# Patient Record
Sex: Male | Born: 1970 | Race: Black or African American | Hispanic: No | Marital: Single | Smoking: Former smoker
Health system: Southern US, Community
[De-identification: ages and names within clinical notes are randomized; demographics above are authoritative.]

## PROBLEM LIST (undated history)

## (undated) ENCOUNTER — Emergency Department (HOSPITAL_COMMUNITY): Admission: EM | Payer: 59 | Source: Home / Self Care

## (undated) DIAGNOSIS — I10 Essential (primary) hypertension: Secondary | ICD-10-CM

## (undated) HISTORY — PX: NO PAST SURGERIES: SHX2092

## (undated) HISTORY — DX: Essential (primary) hypertension: I10

---

## 2000-03-02 ENCOUNTER — Encounter: Payer: Self-pay | Admitting: Emergency Medicine

## 2000-03-02 ENCOUNTER — Emergency Department (HOSPITAL_COMMUNITY): Admission: EM | Admit: 2000-03-02 | Discharge: 2000-03-03 | Payer: Self-pay | Admitting: Emergency Medicine

## 2005-08-30 ENCOUNTER — Emergency Department (HOSPITAL_COMMUNITY): Admission: EM | Admit: 2005-08-30 | Discharge: 2005-08-30 | Payer: Self-pay | Admitting: Family Medicine

## 2007-04-02 ENCOUNTER — Emergency Department (HOSPITAL_COMMUNITY): Admission: EM | Admit: 2007-04-02 | Discharge: 2007-04-02 | Payer: Self-pay | Admitting: Family Medicine

## 2009-10-02 ENCOUNTER — Emergency Department (HOSPITAL_COMMUNITY): Admission: EM | Admit: 2009-10-02 | Discharge: 2009-10-02 | Payer: Self-pay | Admitting: Emergency Medicine

## 2010-07-15 LAB — RPR: RPR Ser Ql: NONREACTIVE

## 2010-07-15 LAB — HIV ANTIBODY (ROUTINE TESTING W REFLEX): HIV: NONREACTIVE

## 2010-07-15 LAB — GC/CHLAMYDIA PROBE AMP, GENITAL
Chlamydia, DNA Probe: NEGATIVE
GC Probe Amp, Genital: NEGATIVE

## 2010-10-31 ENCOUNTER — Inpatient Hospital Stay (INDEPENDENT_AMBULATORY_CARE_PROVIDER_SITE_OTHER)
Admission: RE | Admit: 2010-10-31 | Discharge: 2010-10-31 | Disposition: A | Discharge status: Home / Self Care | Payer: Self-pay | Source: Ambulatory Visit | Attending: Family Medicine | Admitting: Family Medicine

## 2010-10-31 ENCOUNTER — Ambulatory Visit (INDEPENDENT_AMBULATORY_CARE_PROVIDER_SITE_OTHER): Payer: Self-pay

## 2010-10-31 DIAGNOSIS — R071 Chest pain on breathing: Secondary | ICD-10-CM

## 2011-07-12 ENCOUNTER — Emergency Department (HOSPITAL_COMMUNITY): Payer: 59

## 2011-07-12 ENCOUNTER — Emergency Department (HOSPITAL_COMMUNITY)
Admission: EM | Admit: 2011-07-12 | Discharge: 2011-07-12 | Disposition: A | Discharge status: Home / Self Care | Payer: 59 | Attending: Emergency Medicine | Admitting: Emergency Medicine

## 2011-07-12 ENCOUNTER — Encounter (HOSPITAL_COMMUNITY): Payer: Self-pay | Admitting: Family Medicine

## 2011-07-12 DIAGNOSIS — M542 Cervicalgia: Secondary | ICD-10-CM | POA: Insufficient documentation

## 2011-07-12 DIAGNOSIS — M546 Pain in thoracic spine: Secondary | ICD-10-CM | POA: Insufficient documentation

## 2011-07-12 DIAGNOSIS — R079 Chest pain, unspecified: Secondary | ICD-10-CM | POA: Insufficient documentation

## 2011-07-12 DIAGNOSIS — S42033A Displaced fracture of lateral end of unspecified clavicle, initial encounter for closed fracture: Secondary | ICD-10-CM | POA: Insufficient documentation

## 2011-07-12 DIAGNOSIS — M25519 Pain in unspecified shoulder: Secondary | ICD-10-CM | POA: Insufficient documentation

## 2011-07-12 DIAGNOSIS — IMO0002 Reserved for concepts with insufficient information to code with codable children: Secondary | ICD-10-CM | POA: Insufficient documentation

## 2011-07-12 DIAGNOSIS — R109 Unspecified abdominal pain: Secondary | ICD-10-CM | POA: Insufficient documentation

## 2011-07-12 LAB — RAPID URINE DRUG SCREEN, HOSP PERFORMED
Amphetamines: NOT DETECTED
Barbiturates: NOT DETECTED
Benzodiazepines: NOT DETECTED
Cocaine: NOT DETECTED
Opiates: NOT DETECTED
Tetrahydrocannabinol: NOT DETECTED

## 2011-07-12 LAB — URINE MICROSCOPIC-ADD ON

## 2011-07-12 LAB — DIFFERENTIAL
Basophils Relative: 0 % (ref 0–1)
Lymphocytes Relative: 56 % — ABNORMAL HIGH (ref 12–46)
Monocytes Relative: 5 % (ref 3–12)
Neutro Abs: 2.8 10*3/uL (ref 1.7–7.7)
Neutrophils Relative %: 38 % — ABNORMAL LOW (ref 43–77)

## 2011-07-12 LAB — COMPREHENSIVE METABOLIC PANEL
ALT: 49 U/L (ref 0–53)
AST: 44 U/L — ABNORMAL HIGH (ref 0–37)
Albumin: 4.3 g/dL (ref 3.5–5.2)
Alkaline Phosphatase: 46 U/L (ref 39–117)
BUN: 13 mg/dL (ref 6–23)
CO2: 23 mEq/L (ref 19–32)
Calcium: 8.9 mg/dL (ref 8.4–10.5)
Chloride: 103 mEq/L (ref 96–112)
Creatinine, Ser: 1.12 mg/dL (ref 0.50–1.35)
GFR calc Af Amer: >90 mL/min (ref >90)
GFR calc non Af Amer: 81 mL/min — ABNORMAL LOW (ref >90)
Glucose, Bld: 108 mg/dL — ABNORMAL HIGH (ref 70–99)
Potassium: 3.4 mEq/L — ABNORMAL LOW (ref 3.5–5.1)
Sodium: 141 mEq/L (ref 135–145)
Total Bilirubin: 0.3 mg/dL (ref 0.3–1.2)
Total Protein: 7.7 g/dL (ref 6.0–8.3)

## 2011-07-12 LAB — APTT: aPTT: 25 seconds (ref 24–37)

## 2011-07-12 LAB — CBC
HCT: 46.7 % (ref 39.0–52.0)
Hemoglobin: 16.2 g/dL (ref 13.0–17.0)
MCHC: 34.7 g/dL (ref 30.0–36.0)
RBC: 5.45 MIL/uL (ref 4.22–5.81)
WBC: 7.2 10*3/uL (ref 4.0–10.5)

## 2011-07-12 LAB — TYPE AND SCREEN
ABO/RH(D): B POS
Antibody Screen: NEGATIVE

## 2011-07-12 LAB — URINALYSIS, ROUTINE W REFLEX MICROSCOPIC
Bilirubin Urine: NEGATIVE
Glucose, UA: NEGATIVE mg/dL
Ketones, ur: NEGATIVE mg/dL
Leukocytes, UA: NEGATIVE
Nitrite: NEGATIVE
Protein, ur: NEGATIVE mg/dL
Specific Gravity, Urine: 1.027 (ref 1.005–1.030)
Urobilinogen, UA: 0.2 mg/dL (ref 0.0–1.0)
pH: 5 (ref 5.0–8.0)

## 2011-07-12 MED ORDER — HYDROCODONE-ACETAMINOPHEN 5-500 MG PO TABS: 1.0000 | ORAL_TABLET | Freq: Four times a day (QID) | ORAL | Status: AC | PRN

## 2011-07-12 MED ORDER — IOHEXOL 300 MG/ML  SOLN
100.0000 mL | Freq: Once | INTRAMUSCULAR | Status: AC | PRN
  Administered 2011-07-12: 100 mL via INTRAVENOUS

## 2011-07-12 MED ORDER — TETANUS-DIPHTH-ACELL PERTUSSIS 5-2.5-18.5 LF-MCG/0.5 IM SUSP
0.5000 mL | Freq: Once | INTRAMUSCULAR | Status: AC
  Administered 2011-07-12: 0.5 mL via INTRAMUSCULAR
  Filled 2011-07-12: qty 0.5

## 2011-07-12 MED ORDER — SODIUM CHLORIDE 0.9 % IV BOLUS (SEPSIS)
1000.0000 mL | Freq: Once | INTRAVENOUS | Status: AC
  Administered 2011-07-12: 1000 mL via INTRAVENOUS

## 2011-07-12 NOTE — ED Notes (Signed)
Patient currently resting quietly in bed; no respiratory or acute distress noted.  Patient updated on plan of care; informed patient that EDP wants him to ambulate before being discharged.  Family now at bedside.  Patient has no other questions or concerns at this time; will continue to monitor.

## 2011-07-12 NOTE — ED Notes (Signed)
Pt provided with meal tray.

## 2011-07-12 NOTE — ED Notes (Signed)
Patient signed electronic signature; signature pad not working correctly (not crossing over to computer).

## 2011-07-12 NOTE — ED Notes (Signed)
Pt transported to CT ?

## 2011-07-12 NOTE — ED Notes (Signed)
Return from CT

## 2011-07-12 NOTE — Progress Notes (Signed)
Orthopedic Tech Progress Note Patient Details:  Albert Yoder January 09, 1971 [id number]  Other Ortho Devices Type of Ortho Device: Other (comment) (foam arm sling) Ortho Device Location: right arm Ortho Device Interventions: Application   Nikki Dom 07/12/2011, 5:43 PM
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
## 2011-07-12 NOTE — ED Notes (Signed)
Ortho tech at bedside to apply sling 

## 2011-07-12 NOTE — ED Notes (Signed)
Patient given discharge paperwork; went over discharge instructions with patient.  Patient instructed to take Vicodin as directed, to follow up with Dr. Magnus Ivan within one week, and to return to the ED for new, worsening, or concerning symptoms.  Patient instructed to keep arm sling in place until following up with orthopedist.
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
## 2011-07-12 NOTE — ED Notes (Signed)
Ortho paged for sling  

## 2011-07-12 NOTE — Discharge Instructions (Signed)
Clavicle Fracture  A clavicle fracture is a break in the collarbone. This is a common injury, especially in children. Collarbones do not harden until around the age of 20. Most collarbone fractures are treated with a simple arm sling. In some cases a figure-of-eight splint is used to help hold the broken bones in position. Although not often needed, surgery may be required if the bone fragments are not in the correct position (displaced).   HOME CARE INSTRUCTIONS    Apply ice to the injury for 15 to 20 minutes each hour while awake for 2 days. Put the ice in a plastic bag and place a towel between the bag of ice and your skin.   Wear the sling or splint constantly for as long as directed by your caregiver. You may remove the sling or splint for bathing or showering. Be sure to keep your shoulder in the same place as when the sling or splint is on. Do not lift your arm.   If a figure-of-eight splint is applied, it must be tightened by another person every day. Tighten it enough to keep the shoulders held back. Allow enough room to place the index finger between the body and strap. Loosen the splint immediately if you feel numbness or tingling in your hands.   Only take over-the-counter or prescription medicines for pain, discomfort, or fever as directed by your caregiver.   Avoid activities that irritate or increase the pain for 4 to 6 weeks after surgery.   Follow all instructions for follow-up with your caregiver. This includes any referrals, physical therapy, and rehabilitation. Any delay in obtaining necessary care could result in a delay or failure of the injury to heal properly.  SEEK MEDICAL CARE IF:   You have pain and swelling that are not relieved with medications.  SEEK IMMEDIATE MEDICAL CARE IF:   Your arm is numb, cold, or pale, even when the splint is loose.  MAKE SURE YOU:    Understand these instructions.   Will watch your condition.   Will get help right away if you are not doing well or get  worse.  Document Released: 01/22/2005 Document Revised: 04/03/2011 Document Reviewed: 11/18/2007  [employer] Patient Information 2012 [employer].

## 2011-07-12 NOTE — ED Notes (Signed)
Received bedside report from [place], [place].  Patient currently resting quietly in bed; no respiratory or acute distress noted.  Will continue to monitor.

## 2011-07-13 NOTE — ED Provider Notes (Signed)
I saw and evaluated the patient, reviewed the resident's note and I agree with the findings and plan.   Loren Racer, MD 07/13/11 [phone]

## 2011-07-15 DIAGNOSIS — S42009A Fracture of unspecified part of unspecified clavicle, initial encounter for closed fracture: Secondary | ICD-10-CM | POA: Insufficient documentation

## 2011-07-16 ENCOUNTER — Encounter (HOSPITAL_COMMUNITY): Payer: Self-pay

## 2011-07-16 ENCOUNTER — Emergency Department (HOSPITAL_COMMUNITY)
Admission: EM | Admit: 2011-07-16 | Discharge: 2011-07-16 | Disposition: A | Discharge status: Home / Self Care | Payer: 59 | Attending: Emergency Medicine | Admitting: Emergency Medicine

## 2011-07-16 DIAGNOSIS — S42009A Fracture of unspecified part of unspecified clavicle, initial encounter for closed fracture: Secondary | ICD-10-CM

## 2011-07-16 MED ORDER — TRAMADOL HCL 50 MG PO TABS: 50.0000 mg | ORAL_TABLET | Freq: Four times a day (QID) | ORAL | Status: AC | PRN

## 2011-07-16 MED ORDER — DIAZEPAM 5 MG PO TABS
5.0000 mg | ORAL_TABLET | Freq: Once | ORAL | Status: AC
  Administered 2011-07-16: 5 mg via ORAL
  Filled 2011-07-16: qty 1

## 2011-07-16 MED ORDER — ONDANSETRON 8 MG PO TBDP
8.0000 mg | ORAL_TABLET | Freq: Once | ORAL | Status: AC
  Administered 2011-07-16: 8 mg via ORAL
  Filled 2011-07-16: qty 1

## 2011-07-16 MED ORDER — IBUPROFEN 800 MG PO TABS: 800.0000 mg | ORAL_TABLET | Freq: Three times a day (TID) | ORAL | Status: AC | PRN

## 2011-07-16 MED ORDER — HYDROMORPHONE HCL PF 1 MG/ML IJ SOLN
1.0000 mg | Freq: Once | INTRAMUSCULAR | Status: AC
  Administered 2011-07-16: 1 mg via INTRAVENOUS
  Filled 2011-07-16: qty 1

## 2011-07-16 MED ORDER — CYCLOBENZAPRINE HCL 10 MG PO TABS: 10.0000 mg | ORAL_TABLET | Freq: Three times a day (TID) | ORAL | Status: AC | PRN

## 2011-07-16 MED ORDER — IBUPROFEN 800 MG PO TABS
800.0000 mg | ORAL_TABLET | Freq: Once | ORAL | Status: AC
  Administered 2011-07-16: 800 mg via ORAL
  Filled 2011-07-16: qty 1
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
## 2011-07-16 NOTE — Discharge Instructions (Signed)
Please review the instructions below. As discussed, you have been through the acute phase of bone healing and your pain should begin to get better. We have given you something for pain here in the ED tonight and we're prescribing you a short course of Tramadol, Ibuprofen and  A muscle relaxer. Take medications as directed and keep your scheduled appointment with Dr. Magnus Ivan on 07/21/2011.     Clavicle Fracture A clavicle fracture is a broken clavicle (collarbone). The clavicle connects the chest to the shoulder. Most broken clavicles are treated with an arm sling. HOME CARE  Put ice on the injured area.   Put ice in a plastic bag.   Place a towel between the skin and the bag.   Leave the ice on for 15 to 20 minutes, 3 to 4 times a day. Do this for the first 2 days.   Wear the sling or splint for as long as told by your doctor. You may remove the sling or splint for bathing or showering. Keep the shoulder in the same place as when the sling or splint is on. Do not lift the arm.   Allow enough room to place the index finger between the body and strap of the splint. Loosen the splint right away if you lose feeling (numbness) or have tingling in the hands.   Only take medicine as told by your doctor.   Avoid activities that increase pain for 4 to 6 weeks, or as told by your doctor.  GET HELP RIGHT AWAY IF:   There is pain and puffiness (swelling) that is not helped with medicine.   The arm is numb, cold, or pale, even when the sling or splint is loose.  MAKE SURE YOU:   Understand these instructions.   Will watch this condition.   Will get help right away if you or your child is not doing well or gets worse.  Document Released: 10/01/2007 Document Revised: 04/03/2011 Document Reviewed: 01/30/2009 [facility] Patient Information 2012 [place], [place].
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
## 2011-07-25 ENCOUNTER — Emergency Department (HOSPITAL_COMMUNITY)
Admission: EM | Admit: 2011-07-25 | Discharge: 2011-07-25 | Disposition: A | Discharge status: Home / Self Care | Payer: 59 | Attending: Emergency Medicine | Admitting: Emergency Medicine

## 2011-07-25 ENCOUNTER — Encounter (HOSPITAL_COMMUNITY): Payer: Self-pay | Admitting: *Deleted

## 2011-07-25 DIAGNOSIS — F17200 Nicotine dependence, unspecified, uncomplicated: Secondary | ICD-10-CM | POA: Insufficient documentation

## 2011-07-25 DIAGNOSIS — M25559 Pain in unspecified hip: Secondary | ICD-10-CM | POA: Insufficient documentation

## 2011-07-25 DIAGNOSIS — R1900 Intra-abdominal and pelvic swelling, mass and lump, unspecified site: Secondary | ICD-10-CM

## 2011-07-25 DIAGNOSIS — IMO0002 Reserved for concepts with insufficient information to code with codable children: Secondary | ICD-10-CM | POA: Insufficient documentation

## 2011-07-25 DIAGNOSIS — S7010XA Contusion of unspecified thigh, initial encounter: Secondary | ICD-10-CM | POA: Insufficient documentation

## 2011-07-25 LAB — POCT I-STAT, CHEM 8
Calcium, Ion: 1.14 mmol/L (ref 1.12–1.32)
Chloride: 105 mEq/L (ref 96–112)
HCT: 51 % (ref 39.0–52.0)
Hemoglobin: 17.3 g/dL — ABNORMAL HIGH (ref 13.0–17.0)
TCO2: 24 mmol/L (ref 0–100)

## 2011-07-25 LAB — DIFFERENTIAL
Basophils Absolute: 0 10*3/uL (ref 0.0–0.1)
Eosinophils Relative: 3 % (ref 0–5)
Lymphocytes Relative: 41 % (ref 12–46)
Neutro Abs: 3.4 10*3/uL (ref 1.7–7.7)

## 2011-07-25 LAB — CBC
Platelets: 265 10*3/uL (ref 150–400)
RDW: 13.1 % (ref 11.5–15.5)
WBC: 7 10*3/uL (ref 4.0–10.5)

## 2011-07-25 LAB — URINALYSIS, ROUTINE W REFLEX MICROSCOPIC
Ketones, ur: NEGATIVE mg/dL
Leukocytes, UA: NEGATIVE
Nitrite: NEGATIVE
Urobilinogen, UA: 0.2 mg/dL (ref 0.0–1.0)
pH: 5 (ref 5.0–8.0)

## 2011-07-25 MED ORDER — FENTANYL CITRATE 0.05 MG/ML IJ SOLN
50.0000 ug | Freq: Once | INTRAMUSCULAR | Status: AC
  Administered 2011-07-25: 50 ug via INTRAVENOUS
  Filled 2011-07-25: qty 2

## 2011-07-25 MED ORDER — ONDANSETRON HCL 4 MG/2ML IJ SOLN
4.0000 mg | Freq: Once | INTRAMUSCULAR | Status: AC
  Administered 2011-07-25: 4 mg via INTRAVENOUS
  Filled 2011-07-25: qty 2

## 2011-07-25 MED ORDER — HYDROCODONE-ACETAMINOPHEN 5-500 MG PO TABS: 1.0000 | ORAL_TABLET | Freq: Four times a day (QID) | ORAL | Status: AC | PRN
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
## 2011-07-25 NOTE — Discharge Instructions (Signed)
Use warm compresses to thigh and continue to elevate to decrease pain. Alternate between ibuprofen and hydrocodone-acetaminophen as needed for pain but do not drive or operate machinery with hydrocodone-acetaminophen use. Followup with Dr. Edilia Bo in his office in 1 to 2 weeks for recheck of ongoing pain associated with large hematoma but return to emergency department for any changing or worsening symptoms.  Hematoma A hematoma is a pocket of blood that collects under the skin, in an organ, in a body space, in a joint space, or in other tissue. The blood can clot to form a lump that you can see and feel. The lump is often firm, sore, and sometimes even painful and tender. Most hematomas get better in a few days to weeks. However, some hematomas may be serious and require medical care.Hematomas can range in size from very small to very large. CAUSES  A hematoma can be caused by a blunt or penetrating injury. It can also be caused by leakage from a blood vessel under the skin. Spontaneous leakage from a blood vessel is more likely to occur in elderly people, especially those taking blood thinners. Sometimes, a hematoma can develop after certain medical procedures. SYMPTOMS  Unlike a bruise, a hematoma forms a firm lump that you can feel. This lump is the collection of blood. The collection of blood can also cause your skin to turn a blue to dark blue color. If the hematoma is close to the surface of the skin, it often produces a yellowish color in the skin. DIAGNOSIS  Your caregiver can determine whether you have a hematoma based on your history and a physical exam. TREATMENT  Hematomas usually go away on their own over time. Rarely does the blood need to be drained out of the body. HOME CARE INSTRUCTIONS   Put ice on the injured area.   Put ice in a plastic bag.   Place a towel between your skin and the bag.   Leave the ice on for 15 to 20 minutes, 3 to 4 times a day for the first 1 to 2 days.     After the first 2 days, switch to using warm compresses on the hematoma.   Elevate the injured area to help decrease pain and swelling. Wrapping the area with an elastic bandage may also be helpful. Compression helps to reduce swelling and promotes shrinking of the hematoma. Make sure the bandage is not wrapped too tight.   If your hematoma is on a lower extremity and is painful, crutches may be helpful for a couple days.   Only take over-the-counter or prescription medicines for pain, discomfort, or fever as directed by your caregiver. Most patients can take acetaminophen or ibuprofen for the pain.  SEEK IMMEDIATE MEDICAL CARE IF:   You have increasing pain, or your pain is not controlled with medicine.   You have a fever.   You have worsening swelling or discoloration.   Your skin over the hematoma breaks or starts bleeding.  MAKE SURE YOU:   Understand these instructions.   Will watch your condition.   Will get help right away if you are not doing well or get worse.  Document Released: 11/27/2003 Document Revised: 04/03/2011 Document Reviewed: 12/16/2010 [facility] Patient Information 2012 [place], [place].

## 2011-07-25 NOTE — ED Notes (Signed)
Pt placed in gown. Pt with noted swelling to right anterior thigh with heat to abdomen and thigh, will move pt to back for further examination.
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
## 2011-07-25 NOTE — Progress Notes (Signed)
VASCULAR LAB PRELIMINARY  PRELIMINARY  PRELIMINARY  PRELIMINARY  Right thigh arterial duplex completed.    Preliminary report:  No evidence of pseudoaneurysm.  Large hypoechoic area from lateral hip to medial thigh.  Probable hematoma.  No vascular compromise noted.  Terance Hart, RVT 07/25/2011, 12:52 PM
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
## 2012-09-02 ENCOUNTER — Ambulatory Visit (INDEPENDENT_AMBULATORY_CARE_PROVIDER_SITE_OTHER): Payer: 59 | Admitting: Internal Medicine

## 2012-09-02 ENCOUNTER — Encounter: Payer: Self-pay | Admitting: Internal Medicine

## 2012-09-02 ENCOUNTER — Other Ambulatory Visit (INDEPENDENT_AMBULATORY_CARE_PROVIDER_SITE_OTHER): Payer: 59

## 2012-09-02 VITALS — BP 130/90 | HR 82 | Temp 98.6°F | Resp 16 | Ht 74.0 in | Wt 239.0 lb

## 2012-09-02 DIAGNOSIS — G4733 Obstructive sleep apnea (adult) (pediatric): Secondary | ICD-10-CM | POA: Insufficient documentation

## 2012-09-02 DIAGNOSIS — I10 Essential (primary) hypertension: Secondary | ICD-10-CM | POA: Insufficient documentation

## 2012-09-02 DIAGNOSIS — R0609 Other forms of dyspnea: Secondary | ICD-10-CM

## 2012-09-02 DIAGNOSIS — Z0000 Encounter for general adult medical examination without abnormal findings: Secondary | ICD-10-CM

## 2012-09-02 DIAGNOSIS — R0683 Snoring: Secondary | ICD-10-CM

## 2012-09-02 LAB — PSA: PSA: 0.82 ng/mL (ref 0.10–4.00)

## 2012-09-02 LAB — URINALYSIS, ROUTINE W REFLEX MICROSCOPIC
Nitrite: NEGATIVE
Specific Gravity, Urine: >=1.03 (ref 1.000–1.030)
Total Protein, Urine: NEGATIVE
pH: 6 (ref 5.0–8.0)

## 2012-09-02 LAB — COMPREHENSIVE METABOLIC PANEL
ALT: 54 U/L — ABNORMAL HIGH (ref 0–53)
AST: 38 U/L — ABNORMAL HIGH (ref 0–37)
CO2: 27 mEq/L (ref 19–32)
Calcium: 9.2 mg/dL (ref 8.4–10.5)
Chloride: 102 mEq/L (ref 96–112)
GFR: 83.62 mL/min (ref >60.00)
Potassium: 4.1 mEq/L (ref 3.5–5.1)
Sodium: 137 mEq/L (ref 135–145)
Total Protein: 7.2 g/dL (ref 6.0–8.3)

## 2012-09-02 LAB — CBC WITH DIFFERENTIAL/PLATELET
Basophils Absolute: 0 10*3/uL (ref 0.0–0.1)
Eosinophils Absolute: 0.2 10*3/uL (ref 0.0–0.7)
Lymphocytes Relative: 36.8 % (ref 12.0–46.0)
Lymphs Abs: 2.6 10*3/uL (ref 0.7–4.0)
MCHC: 34.6 g/dL (ref 30.0–36.0)
Monocytes Relative: 8.7 % (ref 3.0–12.0)
Platelets: 236 10*3/uL (ref 150.0–400.0)
RDW: 13.9 % (ref 11.5–14.6)

## 2012-09-02 LAB — FECAL OCCULT BLOOD, GUAIAC: Fecal Occult Blood: NEGATIVE

## 2012-09-02 LAB — LIPID PANEL: HDL: 40.6 mg/dL (ref >39.00)

## 2012-09-02 MED ORDER — NEBIVOLOL HCL 5 MG PO TABS: 5.0000 mg | ORAL_TABLET | Freq: Every day | ORAL | Status: DC

## 2012-09-02 NOTE — Progress Notes (Signed)
Subjective:    Patient ID: Albert Yoder, male    DOB: [birth year], 42 y.o.   MRN: [id number]  Hypertension The problem is uncontrolled. Associated symptoms include headaches. Pertinent negatives include no anxiety, blurred vision, chest pain, malaise/fatigue, neck pain, orthopnea, palpitations, peripheral edema, PND, shortness of breath or sweats. There are no associated agents to hypertension. Past treatments include nothing. Compliance problems include exercise and diet.  Identifiable causes of hypertension include sleep apnea.      Review of Systems  Constitutional: Negative.  Negative for fever, chills, malaise/fatigue, diaphoresis, activity change, appetite change, fatigue and unexpected weight change.  HENT: Negative for neck pain.   Eyes: Negative.  Negative for blurred vision.  Respiratory: Positive for apnea (and heavy snoring). Negative for cough, choking, chest tightness, shortness of breath, wheezing and stridor.   Cardiovascular: Negative.  Negative for chest pain, palpitations, orthopnea, leg swelling and PND.  Gastrointestinal: Negative.  Negative for nausea, vomiting, abdominal pain, diarrhea, constipation and blood in stool.  Endocrine: Negative.   Genitourinary: Negative.  Negative for hematuria, scrotal swelling, enuresis, difficulty urinating and testicular pain.  Musculoskeletal: Negative.  Negative for myalgias, back pain, joint swelling, arthralgias and gait problem.  Skin: Negative for color change, pallor, rash and wound.  Allergic/Immunologic: Negative.   Neurological: Positive for headaches. Negative for dizziness, tremors, seizures, syncope, facial asymmetry, speech difficulty, weakness, light-headedness and numbness.  Hematological: Negative.  Negative for adenopathy. Does not bruise/bleed easily.  Psychiatric/Behavioral: Negative.        Objective:   Physical Exam  Vitals reviewed. Constitutional: He is oriented to person, place, and time. He  appears well-developed and well-nourished. No distress.  HENT:  Head: Normocephalic and atraumatic.  Mouth/Throat: Oropharynx is clear and moist. No oropharyngeal exudate.  Eyes: Conjunctivae are normal. Right eye exhibits no discharge. Left eye exhibits no discharge. No scleral icterus.  Neck: Normal range of motion. Neck supple. No JVD present. No tracheal deviation present. No thyromegaly present.  Cardiovascular: Normal rate, regular rhythm, normal heart sounds and intact distal pulses.  Exam reveals no gallop and no friction rub.   No murmur heard. Pulmonary/Chest: Effort normal and breath sounds normal. No stridor. No respiratory distress. He has no wheezes. He has no rales. He exhibits no tenderness.  Abdominal: Soft. Bowel sounds are normal. He exhibits no distension and no mass. There is no tenderness. There is no rebound and no guarding. Hernia confirmed negative in the right inguinal area and confirmed negative in the left inguinal area.  Genitourinary: Rectum normal, prostate normal, testes normal and penis normal. Rectal exam shows no external hemorrhoid, no internal hemorrhoid, no fissure, no mass, no tenderness and anal tone normal. Guaiac negative stool. Prostate is not enlarged and not tender. Right testis shows no mass, no swelling and no tenderness. Right testis is descended. Left testis shows no mass, no swelling and no tenderness. Left testis is descended. Circumcised. No penile erythema or penile tenderness. No discharge found.  Musculoskeletal: Normal range of motion. He exhibits no edema and no tenderness.  Lymphadenopathy:    He has no cervical adenopathy.       Right: No inguinal adenopathy present.       Left: No inguinal adenopathy present.  Neurological: He is oriented to person, place, and time.  Skin: Skin is warm and dry. No rash noted. He is not diaphoretic. No erythema. No pallor.  Psychiatric: He has a normal mood and affect. His behavior is normal. Judgment and  thought content normal.  Lab Results  Component Value Date   WBC 7.0 07/25/2011   HGB 17.3* 07/25/2011   HCT 51.0 07/25/2011   PLT 265 07/25/2011   GLUCOSE 113* 07/25/2011   ALT 49 07/12/2011   AST 44* 07/12/2011   NA 141 07/25/2011   K 3.9 07/25/2011   CL 105 07/25/2011   CREATININE 1.40* 07/25/2011   BUN 15 07/25/2011   CO2 23 07/12/2011   INR 0.93 07/12/2011      Assessment & Plan:

## 2012-09-02 NOTE — Patient Instructions (Addendum)
Health Maintenance, Males A healthy lifestyle and preventative care can promote health and wellness.  Maintain regular health, dental, and eye exams.  Eat a healthy diet. Foods like vegetables, fruits, whole grains, low-fat dairy products, and lean protein foods contain the nutrients you need without too many calories. Decrease your intake of foods high in solid fats, added sugars, and salt. Get information about a proper diet from your caregiver, if necessary.  Regular physical exercise is one of the most important things you can do for your health. Most adults should get at least 150 minutes of moderate-intensity exercise (any activity that increases your heart rate and causes you to sweat) each week. In addition, most adults need muscle-strengthening exercises on 2 or more days a week.   Maintain a healthy weight. The body mass index (BMI) is a screening tool to identify possible weight problems. It provides an estimate of body fat based on height and weight. Your caregiver can help determine your BMI, and can help you achieve or maintain a healthy weight. For adults 20 years and older:  A BMI below 18.5 is considered underweight.  A BMI of 18.5 to 24.9 is normal.  A BMI of 25 to 29.9 is considered overweight.  A BMI of 30 and above is considered obese.  Maintain normal blood lipids and cholesterol by exercising and minimizing your intake of saturated fat. Eat a balanced diet with plenty of fruits and vegetables. Blood tests for lipids and cholesterol should begin at age 20 and be repeated every 5 years. If your lipid or cholesterol levels are high, you are over 50, or you are a high risk for heart disease, you may need your cholesterol levels checked more frequently.Ongoing high lipid and cholesterol levels should be treated with medicines, if diet and exercise are not effective.  If you smoke, find out from your caregiver how to quit. If you do not use tobacco, do not start.  If you  choose to drink alcohol, do not exceed 2 drinks per day. One drink is considered to be 12 ounces (355 mL) of beer, 5 ounces (148 mL) of wine, or 1.5 ounces (44 mL) of liquor.  Avoid use of street drugs. Do not share needles with anyone. Ask for help if you need support or instructions about stopping the use of drugs.  High blood pressure causes heart disease and increases the risk of stroke. Blood pressure should be checked at least every 1 to 2 years. Ongoing high blood pressure should be treated with medicines if weight loss and exercise are not effective.  If you are 45 to 41 years old, ask your caregiver if you should take aspirin to prevent heart disease.  Diabetes screening involves taking a blood sample to check your fasting blood sugar level. This should be done once every 3 years, after age 45, if you are within normal weight and without risk factors for diabetes. Testing should be considered at a younger age or be carried out more frequently if you are overweight and have at least 1 risk factor for diabetes.  Colorectal cancer can be detected and often prevented. Most routine colorectal cancer screening begins at the age of 50 and continues through age 75. However, your caregiver may recommend screening at an earlier age if you have risk factors for colon cancer. On a yearly basis, your caregiver may provide home test kits to check for hidden blood in the stool. Use of a small camera at the end of a tube,   to directly examine the colon (sigmoidoscopy or colonoscopy), can detect the earliest forms of colorectal cancer. Talk to your caregiver about this at age 50, when routine screening begins. Direct examination of the colon should be repeated every 5 to 10 years through age 75, unless early forms of pre-cancerous polyps or small growths are found.  Hepatitis C blood testing is recommended for all people born from 1945 through 1965 and any individual with known risks for hepatitis C.  Healthy  men should no longer receive prostate-specific antigen (PSA) blood tests as part of routine cancer screening. Consult with your caregiver about prostate cancer screening.  Testicular cancer screening is not recommended for adolescents or adult males who have no symptoms. Screening includes self-exam, caregiver exam, and other screening tests. Consult with your caregiver about any symptoms you have or any concerns you have about testicular cancer.  Practice safe sex. Use condoms and avoid high-risk sexual practices to reduce the spread of sexually transmitted infections (STIs).  Use sunscreen with a sun protection factor (SPF) of 30 or greater. Apply sunscreen liberally and repeatedly throughout the day. You should seek shade when your shadow is shorter than you. Protect yourself by wearing long sleeves, pants, a wide-brimmed hat, and sunglasses year round, whenever you are outdoors.  Notify your caregiver of new moles or changes in moles, especially if there is a change in shape or color. Also notify your caregiver if a mole is larger than the size of a pencil eraser.  A one-time screening for abdominal aortic aneurysm (AAA) and surgical repair of large AAAs by sound wave imaging (ultrasonography) is recommended for ages 65 to 75 years who are current or former smokers.  Stay current with your immunizations. Document Released: 10/11/2007 Document Revised: 07/07/2011 Document Reviewed: 09/09/2010 [employer] Patient Information 2013 [employer]. Hypertension As your heart beats, it forces blood through your arteries. This force is your blood pressure. If the pressure is too high, it is called hypertension (HTN) or high blood pressure. HTN is dangerous because you may have it and not know it. High blood pressure may mean that your heart has to work harder to pump blood. Your arteries may be narrow or stiff. The extra work puts you at risk for heart disease, stroke, and other problems.  Blood pressure  consists of two numbers, a higher number over a lower, 110/72, for example. It is stated as "110 over 72." The ideal is below 120 for the top number (systolic) and under 80 for the bottom (diastolic). Write down your blood pressure today. You should pay close attention to your blood pressure if you have certain conditions such as:  Heart failure.  Prior heart attack.  Diabetes  Chronic kidney disease.  Prior stroke.  Multiple risk factors for heart disease. To see if you have HTN, your blood pressure should be measured while you are seated with your arm held at the level of the heart. It should be measured at least twice. A one-time elevated blood pressure reading (especially in the Emergency Department) does not mean that you need treatment. There may be conditions in which the blood pressure is different between your right and left arms. It is important to see your caregiver soon for a recheck. Most people have essential hypertension which means that there is not a specific cause. This type of high blood pressure may be lowered by changing lifestyle factors such as:  Stress.  Smoking.  Lack of exercise.  Excessive weight.  Drug/tobacco/alcohol use.    Eating less salt. Most people do not have symptoms from high blood pressure until it has caused damage to the body. Effective treatment can often prevent, delay or reduce that damage. TREATMENT  When a cause has been identified, treatment for high blood pressure is directed at the cause. There are a large number of medications to treat HTN. These fall into several categories, and your caregiver will help you select the medicines that are best for you. Medications may have side effects. You should review side effects with your caregiver. If your blood pressure stays high after you have made lifestyle changes or started on medicines,   Your medication(s) may need to be changed.  Other problems may need to be addressed.  Be certain you  understand your prescriptions, and know how and when to take your medicine.  Be sure to follow up with your caregiver within the time frame advised (usually within two weeks) to have your blood pressure rechecked and to review your medications.  If you are taking more than one medicine to lower your blood pressure, make sure you know how and at what times they should be taken. Taking two medicines at the same time can result in blood pressure that is too low. SEEK IMMEDIATE MEDICAL CARE IF:  You develop a severe headache, blurred or changing vision, or confusion.  You have unusual weakness or numbness, or a faint feeling.  You have severe chest or abdominal pain, vomiting, or breathing problems. MAKE SURE YOU:   Understand these instructions.  Will watch your condition.  Will get help right away if you are not doing well or get worse. Document Released: 04/14/2005 Document Revised: 07/07/2011 Document Reviewed: 12/03/2007 [employer] Patient Information 2013 [employer].  

## 2012-09-03 ENCOUNTER — Encounter: Payer: Self-pay | Admitting: Internal Medicine

## 2012-09-03 NOTE — Assessment & Plan Note (Signed)
Check labs today I have asked him to see sleep medicine

## 2012-09-03 NOTE — Assessment & Plan Note (Addendum)
He will start treating this with bystolic I will check his labs today to look for secondary causes His EKG shows no LVH I have asked him to have his snoring/apnea evaluated He will start lifestyle modifications

## 2012-09-03 NOTE — Assessment & Plan Note (Addendum)
Exam done Labs ordered Vaccines were reviewed Pt ed material was given 

## 2012-09-29 ENCOUNTER — Institutional Professional Consult (permissible substitution): Payer: 59 | Admitting: Pulmonary Disease

## 2012-10-01 ENCOUNTER — Encounter: Payer: Self-pay | Admitting: *Deleted

## 2012-10-01 ENCOUNTER — Ambulatory Visit (INDEPENDENT_AMBULATORY_CARE_PROVIDER_SITE_OTHER): Payer: 59 | Admitting: Pulmonary Disease

## 2012-10-01 ENCOUNTER — Encounter: Payer: Self-pay | Admitting: Pulmonary Disease

## 2012-10-01 VITALS — BP 136/86 | HR 65 | Temp 98.2°F | Ht 74.0 in | Wt 249.6 lb

## 2012-10-01 DIAGNOSIS — R0683 Snoring: Secondary | ICD-10-CM

## 2012-10-01 DIAGNOSIS — G4733 Obstructive sleep apnea (adult) (pediatric): Secondary | ICD-10-CM

## 2012-10-01 NOTE — Patient Instructions (Addendum)
Will schedule for home sleep testing, and will call you with the results. Work on weight loss.

## 2012-10-01 NOTE — Progress Notes (Signed)
Subjective:    Patient ID: Albert Yoder, male    DOB: 1970/12/20, 42 y.o.   MRN: [id number]  HPI The patient is a 42 year old male who I have been asked to see for possible obstructive sleep apnea.  He has been noted by his bed partner to have loud snoring, as well as an abnormal breathing pattern during sleep.  He has very restless sleep, with frequent awakenings.  He does not feel rested in the mornings upon arising, and describes inappropriate daytime sleepiness while at work.  He will also follow sleep very easily in the early evening watching television in his recliner.  He also notes mild sleep pressure driving home from work.  The patient's weight is up approximately 15 pounds over the last 2 years, and his Epworth score today is 9.      Sleep Questionnaire What time do you typically go to bed?( Between what hours) 10-11pm 10-11pm at 1056 on 10/01/12 by Darrell Jewel, CMA How long does it take you to fall asleep? 15 minutes 15 minutes at 1056 on 10/01/12 by Darrell Jewel, CMA How many times during the night do you wake up? 4 4 at 1056 on 10/01/12 by Darrell Jewel, CMA What time do you get out of bed to start your day? 0600 0600 at 1056 on 10/01/12 by Darrell Jewel, CMA Do you drive or operate heavy machinery in your occupation? Yes Yes at 1056 on 10/01/12 by Darrell Jewel, CMA How much has your weight changed (up or down) over the past two years? (In pounds) 15 lb (6.804 kg) 15 lb (6.804 kg) at 1056 on 10/01/12 by Darrell Jewel, CMA Have you ever had a sleep study before? No No at 1056 on 10/01/12 by Darrell Jewel, CMA Do you currently use CPAP? No No at 1056 on 10/01/12 by Darrell Jewel, CMA Do you wear oxygen at any time? No No at 1056 on 10/01/12 by Darrell Jewel, CMA   Review of Systems  Constitutional: Negative for fever and unexpected weight change.  HENT: Positive for congestion. Negative for ear  pain, nosebleeds, sore throat, rhinorrhea, sneezing, trouble swallowing, dental problem, postnasal drip and sinus pressure.   Eyes: Negative for redness and itching.  Respiratory: Positive for shortness of breath. Negative for cough, chest tightness and wheezing.   Cardiovascular: Negative for palpitations and leg swelling.  Gastrointestinal: Negative for nausea and vomiting.  Genitourinary: Negative for dysuria.  Musculoskeletal: Negative for joint swelling.  Skin: Negative for rash.  Neurological: Positive for headaches.  Hematological: Does not bruise/bleed easily.  Psychiatric/Behavioral: Negative for dysphoric mood. The patient is not nervous/anxious.        Objective:   Physical Exam Constitutional:  Overweight male,  no acute distress  HENT:  Nares patent without discharge, but enlarged turbinates seen  Oropharynx without exudate, palate and uvula are moderately elongated.   Eyes:  Perrla, eomi, no scleral icterus  Neck:  No JVD, no TMG  Cardiovascular:  Normal rate, regular rhythm, no rubs or gallops.  No murmurs        Intact distal pulses  Pulmonary :  Normal breath sounds, no stridor or respiratory distress   No rales, rhonchi, or wheezing  Abdominal:  Soft, nondistended, bowel sounds present.  No tenderness noted.   Musculoskeletal:  No lower extremity edema noted.  Lymph Nodes:  No cervical lymphadenopathy noted  Skin:  No cyanosis noted  Neurologic:  Alert, appropriate, moves all 4 extremities  without obvious deficit.         Assessment & Plan:

## 2012-10-01 NOTE — Assessment & Plan Note (Signed)
The patient's history is very suggestive of clinically significant sleep apnea.  I had a long discussion with him about sleep disordered breathing, including its impact to his quality of life and cardiovascular health.  I think he is an excellent candidate for home sleep testing, and the patient is agreeable to this approach.  I will see him back once the data is available.

## 2012-10-14 ENCOUNTER — Ambulatory Visit: Payer: 59

## 2012-10-14 DIAGNOSIS — R0609 Other forms of dyspnea: Secondary | ICD-10-CM

## 2012-10-14 DIAGNOSIS — R0683 Snoring: Secondary | ICD-10-CM

## 2012-10-14 DIAGNOSIS — G478 Other sleep disorders: Secondary | ICD-10-CM

## 2012-10-28 ENCOUNTER — Telehealth: Payer: Self-pay | Admitting: Pulmonary Disease

## 2012-10-28 DIAGNOSIS — G4733 Obstructive sleep apnea (adult) (pediatric): Secondary | ICD-10-CM

## 2012-10-28 NOTE — Telephone Encounter (Signed)
Pt to call back with next available day off so that he will not have to take any extra hours off.

## 2012-10-28 NOTE — Telephone Encounter (Signed)
Albert Yoder, please call pt and schedule ov to review recent sleep study.  Let him know I apologize for the delay, but the server with the data went down for a period of time.

## 2012-11-08 ENCOUNTER — Encounter: Payer: Self-pay | Admitting: Pulmonary Disease

## 2012-11-08 NOTE — Telephone Encounter (Signed)
Unable to reach patient via phone.  Last conversation on phone, patient was unable to schedule appt d/t not knowing work schedule and stated that he would call us back.  Have not heard from patient and unable to reach.  MyChart message sent to patient to schedule appt.
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
## 2013-03-03 ENCOUNTER — Other Ambulatory Visit: Payer: Self-pay

## 2013-08-11 ENCOUNTER — Ambulatory Visit: Payer: 59 | Admitting: Pulmonary Disease

## 2013-08-17 ENCOUNTER — Encounter: Payer: Self-pay | Admitting: Pulmonary Disease

## 2013-08-17 ENCOUNTER — Ambulatory Visit (INDEPENDENT_AMBULATORY_CARE_PROVIDER_SITE_OTHER): Payer: 59 | Admitting: Pulmonary Disease

## 2013-08-17 ENCOUNTER — Ambulatory Visit: Payer: 59 | Admitting: Pulmonary Disease

## 2013-08-17 VITALS — BP 122/88 | HR 69 | Temp 98.7°F | Ht 74.0 in | Wt 251.0 lb

## 2013-08-17 DIAGNOSIS — G4733 Obstructive sleep apnea (adult) (pediatric): Secondary | ICD-10-CM

## 2013-08-17 NOTE — Assessment & Plan Note (Addendum)
The patient has mild obstructive sleep apnea by his recent home sleep test, but does not feel that he is overly symptomatic. I have outlined a conservative approach with a trial of weight loss and positional therapy as well as smoking cessation, versus a more aggressive treatment with a dental appliance or CPAP while working on weight loss. After a long discussion, the patient has decided that he wants to try working on weight loss first, and he will call if he changes his mind and wishes to consider CPAP.  The good news here is this level of sleep apnea represents very little risk to his cardiovascular health.

## 2013-08-17 NOTE — Patient Instructions (Signed)
Work on weight loss over the next 6mos, but let me know if you are not making progress and would like to try cpap. followup with me as needed.

## 2013-08-17 NOTE — Progress Notes (Signed)
   Subjective:    Patient ID: Albert Yoder, male    DOB: 09-Aug-1970, 43 y.o.   MRN: [id number]  HPI The patient comes in today for followup after his recent home sleep test. He was found to have mild OSA, with an AHI of 11 events per hour and transient oxygen desaturation as low as 77%. I have reviewed the study with him in detail, and answered all of his questions.   Review of Systems  Constitutional: Negative for fever and unexpected weight change.  HENT: Negative for congestion, dental problem, ear pain, nosebleeds, postnasal drip, rhinorrhea, sinus pressure, sneezing, sore throat and trouble swallowing.   Eyes: Negative for redness and itching.  Respiratory: Negative for cough, chest tightness, shortness of breath and wheezing.   Cardiovascular: Negative for palpitations and leg swelling.  Gastrointestinal: Negative for nausea and vomiting.  Genitourinary: Negative for dysuria.  Musculoskeletal: Negative for joint swelling.  Skin: Negative for rash.  Neurological: Negative for headaches.  Hematological: Does not bruise/bleed easily.  Psychiatric/Behavioral: Negative for dysphoric mood. The patient is not nervous/anxious.        Objective:   Physical Exam Well-developed male in no acute distress Nose without purulence or discharge noted Neck without lymphadenopathy or thyromegaly Lower extremities without edema, no cyanosis Alert and oriented, moves all 4 extremities.       Assessment & Plan:

## 2013-09-08 ENCOUNTER — Encounter: Payer: 59 | Admitting: Internal Medicine

## 2014-02-10 ENCOUNTER — Other Ambulatory Visit: Payer: Self-pay

## 2014-03-30 NOTE — ED Notes (Signed)
Called x 1 no answer

## 2014-03-30 NOTE — ED Notes (Signed)
Called for patient x2. No answer.

## 2014-11-30 ENCOUNTER — Emergency Department (HOSPITAL_COMMUNITY): Payer: Self-pay

## 2014-11-30 ENCOUNTER — Emergency Department (HOSPITAL_COMMUNITY)
Admission: EM | Admit: 2014-11-30 | Discharge: 2014-11-30 | Disposition: A | Discharge status: Home / Self Care | Payer: Self-pay | Attending: Emergency Medicine | Admitting: Emergency Medicine

## 2014-11-30 ENCOUNTER — Encounter (HOSPITAL_COMMUNITY): Payer: Self-pay | Admitting: Emergency Medicine

## 2014-11-30 DIAGNOSIS — S0990XA Unspecified injury of head, initial encounter: Secondary | ICD-10-CM | POA: Insufficient documentation

## 2014-11-30 DIAGNOSIS — Z23 Encounter for immunization: Secondary | ICD-10-CM | POA: Insufficient documentation

## 2014-11-30 DIAGNOSIS — Y939 Activity, unspecified: Secondary | ICD-10-CM | POA: Insufficient documentation

## 2014-11-30 DIAGNOSIS — T148XXA Other injury of unspecified body region, initial encounter: Secondary | ICD-10-CM

## 2014-11-30 DIAGNOSIS — S0101XA Laceration without foreign body of scalp, initial encounter: Secondary | ICD-10-CM | POA: Insufficient documentation

## 2014-11-30 DIAGNOSIS — Y999 Unspecified external cause status: Secondary | ICD-10-CM | POA: Insufficient documentation

## 2014-11-30 DIAGNOSIS — Z720 Tobacco use: Secondary | ICD-10-CM | POA: Insufficient documentation

## 2014-11-30 DIAGNOSIS — S4991XA Unspecified injury of right shoulder and upper arm, initial encounter: Secondary | ICD-10-CM | POA: Insufficient documentation

## 2014-11-30 DIAGNOSIS — I10 Essential (primary) hypertension: Secondary | ICD-10-CM | POA: Insufficient documentation

## 2014-11-30 DIAGNOSIS — Y929 Unspecified place or not applicable: Secondary | ICD-10-CM | POA: Insufficient documentation

## 2014-11-30 MED ORDER — TRAMADOL HCL 50 MG PO TABS: 50.0000 mg | ORAL_TABLET | Freq: Four times a day (QID) | ORAL | Status: DC | PRN

## 2014-11-30 MED ORDER — IBUPROFEN 800 MG PO TABS
800.0000 mg | ORAL_TABLET | Freq: Once | ORAL | Status: AC
  Administered 2014-11-30: 800 mg via ORAL
  Filled 2014-11-30: qty 1

## 2014-11-30 MED ORDER — TETANUS-DIPHTH-ACELL PERTUSSIS 5-2.5-18.5 LF-MCG/0.5 IM SUSP
0.5000 mL | Freq: Once | INTRAMUSCULAR | Status: AC
  Administered 2014-11-30: 0.5 mL via INTRAMUSCULAR
  Filled 2014-11-30: qty 0.5
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
## 2014-11-30 NOTE — ED Notes (Signed)
Patient is asking for tylenol for his headache.

## 2014-11-30 NOTE — ED Notes (Signed)
Pt back from CT

## 2016-02-14 ENCOUNTER — Ambulatory Visit (INDEPENDENT_AMBULATORY_CARE_PROVIDER_SITE_OTHER): Payer: BLUE CROSS/BLUE SHIELD | Admitting: Orthopaedic Surgery

## 2016-02-14 DIAGNOSIS — S92352D Displaced fracture of fifth metatarsal bone, left foot, subsequent encounter for fracture with routine healing: Secondary | ICD-10-CM

## 2017-03-04 ENCOUNTER — Telehealth (INDEPENDENT_AMBULATORY_CARE_PROVIDER_SITE_OTHER): Payer: Self-pay | Admitting: Orthopaedic Surgery

## 2017-03-04 NOTE — Telephone Encounter (Signed)
Albert Yoder,Albert Yoder 1970/05/03 [id number]    Pharmacy  [facility] [place] [address]    Med refill  Pt needs med refill for blood pressure.Pt stated he hasn't seen Albert Yoder in a while and is blood pressure is rising.

## 2017-03-04 NOTE — Telephone Encounter (Signed)
Called pt he will find PCP to provide care for him

## 2017-03-04 NOTE — Telephone Encounter (Signed)
Can you please let him know he needs to get this from his PCP, Dr, Magnus IvanBlackman can't give him this.

## 2017-03-17 ENCOUNTER — Ambulatory Visit: Payer: BLUE CROSS/BLUE SHIELD | Admitting: Family Medicine

## 2021-02-04 ENCOUNTER — Ambulatory Visit (INDEPENDENT_AMBULATORY_CARE_PROVIDER_SITE_OTHER): Payer: Self-pay | Admitting: Nurse Practitioner

## 2021-02-04 ENCOUNTER — Encounter: Payer: Self-pay | Admitting: Nurse Practitioner

## 2021-02-04 ENCOUNTER — Other Ambulatory Visit: Payer: Self-pay

## 2021-02-04 VITALS — BP 176/100 | HR 90 | Temp 98.4°F | Ht 74.0 in | Wt 305.0 lb

## 2021-02-04 DIAGNOSIS — I160 Hypertensive urgency: Secondary | ICD-10-CM

## 2021-02-04 DIAGNOSIS — F1010 Alcohol abuse, uncomplicated: Secondary | ICD-10-CM

## 2021-02-04 DIAGNOSIS — G4739 Other sleep apnea: Secondary | ICD-10-CM

## 2021-02-04 DIAGNOSIS — Z131 Encounter for screening for diabetes mellitus: Secondary | ICD-10-CM

## 2021-02-04 DIAGNOSIS — I10 Essential (primary) hypertension: Secondary | ICD-10-CM

## 2021-02-04 LAB — POCT GLYCOSYLATED HEMOGLOBIN (HGB A1C): Hemoglobin A1C: 5.5 % (ref 4.0–5.6)

## 2021-02-04 LAB — POCT URINALYSIS DIP (CLINITEK)
Bilirubin, UA: NEGATIVE
Blood, UA: NEGATIVE
Glucose, UA: NEGATIVE mg/dL
Leukocytes, UA: NEGATIVE
Nitrite, UA: NEGATIVE
POC PROTEIN,UA: NEGATIVE
Spec Grav, UA: 1.02 (ref 1.010–1.025)
Urobilinogen, UA: 4 E.U./dL — AB
pH, UA: 6.5 (ref 5.0–8.0)

## 2021-02-04 LAB — GLUCOSE, POCT (MANUAL RESULT ENTRY): POC Glucose: 99 mg/dl (ref 70–99)

## 2021-02-04 MED ORDER — HYDROCHLOROTHIAZIDE 25 MG PO TABS: 25.0000 mg | ORAL_TABLET | Freq: Every day | ORAL | 3 refills | Status: DC

## 2021-02-04 MED ORDER — AMLODIPINE BESYLATE 10 MG PO TABS: 10.0000 mg | ORAL_TABLET | Freq: Every day | ORAL | 0 refills | Status: DC

## 2021-02-04 NOTE — Patient Instructions (Signed)
You were seen today in the [facility] to establish care. Labs were collected, results will be available via MyChart or, if abnormal, you will be contacted by clinic staff. You were prescribed medications, please take as directed. Please follow up in 1 mth for reevaluation.

## 2021-02-04 NOTE — Progress Notes (Signed)
[facility] Patient [facility] [address] [place], [place]  [address] Phone:  [phone]   Fax:  [phone] Subjective:   Patient ID: Albert Yoder, male    DOB: 1970-08-05, 50 y.o.   MRN: [id number]  Chief Complaint  Patient presents with   Establish Care    Requesting bp check and blood sugar.    HPI Albert Yoder 50 y.o. male with history of hypertension to the [facility] to establish care. Wife at bedside indicates that patient has had problems with B/P and alcohol usage for several years. Does not adhere to diet and/ exercise regimen. 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 Verbalizes drinking 3-4 beers per day and 1 bottle of vodka on the weekends. Endorses drinking in the morning at times. Longest period without alcohol usage 2 days. Denies any withdrawal symptoms. When questioned if he wanted to quit drinking alcohol, he verbalized being uncertain.  Wife also concerned about patient frequently gagging in his sleep. Patient denies any abnormal sleeping habits or daytime fatigue. Wife requesting completion of sleep study. Denies any other complaints today. Denies any fever. Denies any fatigue, chest pain, shortness of breath, HA or dizziness. Denies any blurred vision, numbness or tingling.    Past Medical History:  Diagnosis Date   Hypertension     Past Surgical History:  Procedure Laterality Date   NO PAST SURGERIES      Family History  Problem Relation Age of Onset   Hypertension Mother    Diabetes Neg Hx    Early death Neg Hx    Heart disease Neg Hx    Hyperlipidemia Neg Hx    Kidney disease Neg Hx    Stroke Neg Hx    Breast cancer Mother     Social History   Socioeconomic History   Marital status: Single    Spouse name: Not on file   Number of children: Not on file   Years of education: Not on file   Highest education level: Not on file  Occupational History   Not on file  Tobacco Use   Smoking status: Some Days    Packs/day: 0.30     Years: 20.00    Pack years: 6.00    Types: Cigarettes   Smokeless tobacco: Never   Tobacco comments:    "off and on"  Substance and Sexual Activity   Alcohol use: Yes    Alcohol/week: 5.0 standard drinks    Types: 5 Cans of beer per week   Drug use: No   Sexual activity: Yes  Other Topics Concern   Not on file  Social History Narrative   Not on file   Social Determinants of Health   Financial Resource Strain: Not on file  Food Insecurity: Not on file  Transportation Needs: Not on file  Physical Activity: Not on file  Stress: Not on file  Social Connections: Not on file  Intimate Partner Violence: Not on file    Outpatient Medications Prior to Visit  Medication Sig Dispense Refill   traMADol (ULTRAM) 50 MG tablet Take 1 tablet (50 mg total) by mouth every 6 (six) hours as needed. 15 tablet 0   No facility-administered medications prior to visit.    No Known Allergies  Review of Systems  Constitutional:  Negative for chills, fever and malaise/fatigue.  HENT: Negative.    Eyes: Negative.   Respiratory:  Negative for cough and shortness of breath.   Cardiovascular:  Negative for chest pain, palpitations and  leg swelling.  Gastrointestinal:  Negative for abdominal pain, blood in stool, constipation, diarrhea, nausea and vomiting.  Genitourinary: Negative.   Musculoskeletal: Negative.   Skin: Negative.   Neurological: Negative.   Psychiatric/Behavioral:  Negative for depression. The patient is not nervous/anxious.   All other systems reviewed and are negative.     Objective:    Physical Exam Vitals reviewed.  Constitutional:      General: He is not in acute distress.    Appearance: Normal appearance.  HENT:     Head: Normocephalic.     Right Ear: Tympanic membrane, ear canal and external ear normal.     Left Ear: Tympanic membrane, ear canal and external ear normal.     Nose: Nose normal.     Mouth/Throat:     Mouth: Mucous membranes are moist.     Pharynx:  Oropharynx is clear.  Eyes:     Extraocular Movements: Extraocular movements intact.     Conjunctiva/sclera: Conjunctivae normal.     Pupils: Pupils are equal, round, and reactive to light.  Cardiovascular:     Rate and Rhythm: Normal rate and regular rhythm.     Pulses: Normal pulses.     Heart sounds: Normal heart sounds.     Comments: No obvious peripheral edema Pulmonary:     Effort: Pulmonary effort is normal.     Breath sounds: Normal breath sounds.  Musculoskeletal:     Cervical back: Normal range of motion and neck supple.  Skin:    General: Skin is warm and dry.     Capillary Refill: Capillary refill takes less than 2 seconds.  Neurological:     General: No focal deficit present.     Mental Status: He is alert and oriented to person, place, and time.     Motor: No weakness.     Coordination: Coordination normal.     Gait: Gait normal.     Deep Tendon Reflexes: Reflexes normal.  Psychiatric:        Mood and Affect: Mood normal.        Behavior: Behavior normal.        Thought Content: Thought content normal.        Judgment: Judgment normal.    BP (!) 176/100   Pulse 90   Temp 98.4 F (36.9 C)   Ht 6\' 2"  (1.88 m)   Wt (!) 305 lb (138.3 kg)   SpO2 97%   BMI 39.16 kg/m  Wt Readings from Last 3 Encounters:  02/04/21 (!) 305 lb (138.3 kg)  08/17/13 251 lb (113.9 kg)  10/01/12 249 lb 9.6 oz (113.2 kg)    Immunization History  Administered Date(s) Administered   Tdap 07/12/2011, 11/30/2014    Diabetic Foot Exam - Simple   No data filed     Lab Results  Component Value Date   TSH 0.70 09/02/2012   Lab Results  Component Value Date   WBC 7.1 09/02/2012   HGB 16.8 09/02/2012   HCT 48.6 09/02/2012   MCV 89.0 09/02/2012   PLT 236.0 09/02/2012   Lab Results  Component Value Date   NA 137 09/02/2012   K 4.1 09/02/2012   CO2 27 09/02/2012   GLUCOSE 92 09/02/2012   BUN 14 09/02/2012   CREATININE 1.2 09/02/2012   BILITOT 0.6 09/02/2012   ALKPHOS  49 09/02/2012   AST 38 (H) 09/02/2012   ALT 54 (H) 09/02/2012   PROT 7.2 09/02/2012   ALBUMIN 4.2 09/02/2012   CALCIUM  9.2 09/02/2012   GFR 83.62 09/02/2012   Lab Results  Component Value Date   CHOL 189 09/02/2012   Lab Results  Component Value Date   HDL 40.60 09/02/2012   No results found for: [facility] Lab Results  Component Value Date   TRIG (H) 09/02/2012    578.0 Triglyceride is over 400; calculations on Lipids are invalid.   Lab Results  Component Value Date   CHOLHDL 5 09/02/2012   Lab Results  Component Value Date   HGBA1C 5.5 02/04/2021       Assessment & Plan:   Problem List Items Addressed This Visit       Cardiovascular and Mediastinum   Essential hypertension, benign - Primary   Relevant Medications   amLODipine (NORVASC) 10 MG tablet   hydrochlorothiazide (HYDRODIURIL) 25 MG tablet   Other Relevant Orders   POCT URINALYSIS DIP (CLINITEK) (Completed)   CBC with Differential/Platelet   Comprehensive metabolic panel   Lipid panel   Other Visit Diagnoses     Screening for diabetes mellitus       Relevant Orders   HgB A1c (Completed)   Glucose (CBG) (Completed)   Sleep apnea-like behavior       Relevant Orders   PSG Sleep Study   Hypertensive urgency       Relevant Medications   amLODipine (NORVASC) 10 MG tablet   hydrochlorothiazide (HYDRODIURIL) 25 MG tablet Discussed at length, diet options Encouraged continued diet and exercise efforts  Encouraged continued compliance with medication  Patient signed waiver prior to discharge, discussed red flag symptoms that require immediate care in the ED and/ clinic  Alcohol Abuse Discussed negative affects of continued abuse of alcohol  Will discuss possible options for abstinence at follow up    Follow up in 1 mth for reevaluation of B/P and alcohol use/abuse, sooner as needed    I have discontinued Cristal Deer D. Zurawski's traMADol. I am also having him maintain his amLODipine and  hydrochlorothiazide.  Meds ordered this encounter  Medications   DISCONTD: hydrochlorothiazide (HYDRODIURIL) 25 MG tablet    Sig: Take 1 tablet (25 mg total) by mouth daily.    Dispense:  90 tablet    Refill:  3   DISCONTD: amLODipine (NORVASC) 10 MG tablet    Sig: Take 1 tablet (10 mg total) by mouth daily.    Dispense:  30 tablet    Refill:  0   amLODipine (NORVASC) 10 MG tablet    Sig: Take 1 tablet (10 mg total) by mouth daily.    Dispense:  30 tablet    Refill:  0   hydrochlorothiazide (HYDRODIURIL) 25 MG tablet    Sig: Take 1 tablet (25 mg total) by mouth daily.    Dispense:  90 tablet    Refill:  3     Kathrynn Speed, NP

## 2021-02-05 LAB — LIPID PANEL
Chol/HDL Ratio: 5.8 ratio — ABNORMAL HIGH (ref 0.0–5.0)
Cholesterol, Total: 190 mg/dL (ref 100–199)
HDL: 33 mg/dL — ABNORMAL LOW (ref >39)
LDL Chol Calc (NIH): 46 mg/dL (ref 0–99)
Triglycerides: 789 mg/dL (ref 0–149)
VLDL Cholesterol Cal: 111 mg/dL — ABNORMAL HIGH (ref 5–40)

## 2021-02-05 LAB — COMPREHENSIVE METABOLIC PANEL
ALT: 54 IU/L — ABNORMAL HIGH (ref 0–44)
AST: 52 IU/L — ABNORMAL HIGH (ref 0–40)
Albumin/Globulin Ratio: 1.1 — ABNORMAL LOW (ref 1.2–2.2)
Albumin: 4.1 g/dL (ref 4.0–5.0)
Alkaline Phosphatase: 76 IU/L (ref 44–121)
BUN/Creatinine Ratio: 15 (ref 9–20)
BUN: 14 mg/dL (ref 6–24)
Bilirubin Total: 0.3 mg/dL (ref 0.0–1.2)
CO2: 18 mmol/L — ABNORMAL LOW (ref 20–29)
Calcium: 9.3 mg/dL (ref 8.7–10.2)
Chloride: 100 mmol/L (ref 96–106)
Creatinine, Ser: 0.94 mg/dL (ref 0.76–1.27)
Globulin, Total: 3.9 g/dL (ref 1.5–4.5)
Glucose: 94 mg/dL (ref 70–99)
Potassium: 4.1 mmol/L (ref 3.5–5.2)
Sodium: 140 mmol/L (ref 134–144)
Total Protein: 8 g/dL (ref 6.0–8.5)
eGFR: 100 mL/min/{1.73_m2} (ref >59)

## 2021-02-05 LAB — CBC WITH DIFFERENTIAL/PLATELET
Basophils Absolute: 0.1 10*3/uL (ref 0.0–0.2)
Basos: 1 %
EOS (ABSOLUTE): 0.1 10*3/uL (ref 0.0–0.4)
Eos: 2 %
Hematocrit: 48.1 % (ref 37.5–51.0)
Hemoglobin: 16.6 g/dL (ref 13.0–17.7)
Immature Grans (Abs): 0 10*3/uL (ref 0.0–0.1)
Immature Granulocytes: 0 %
Lymphocytes Absolute: 2.8 10*3/uL (ref 0.7–3.1)
Lymphs: 45 %
MCH: 31.1 pg (ref 26.6–33.0)
MCHC: 34.5 g/dL (ref 31.5–35.7)
MCV: 90 fL (ref 79–97)
Monocytes Absolute: 0.6 10*3/uL (ref 0.1–0.9)
Monocytes: 10 %
Neutrophils Absolute: 2.6 10*3/uL (ref 1.4–7.0)
Neutrophils: 42 %
Platelets: 214 10*3/uL (ref 150–450)
RBC: 5.33 x10E6/uL (ref 4.14–5.80)
RDW: 13.1 % (ref 11.6–15.4)
WBC: 6.2 10*3/uL (ref 3.4–10.8)

## 2021-02-08 ENCOUNTER — Encounter: Payer: Self-pay | Admitting: Nurse Practitioner

## 2021-02-08 ENCOUNTER — Other Ambulatory Visit: Payer: Self-pay

## 2021-02-08 ENCOUNTER — Telehealth: Payer: Self-pay

## 2021-02-08 ENCOUNTER — Ambulatory Visit (INDEPENDENT_AMBULATORY_CARE_PROVIDER_SITE_OTHER): Payer: Self-pay | Admitting: Nurse Practitioner

## 2021-02-08 VITALS — BP 172/106 | HR 97 | Temp 98.6°F | Ht 74.0 in | Wt 302.6 lb

## 2021-02-08 DIAGNOSIS — E7889 Other lipoprotein metabolism disorders: Secondary | ICD-10-CM

## 2021-02-08 DIAGNOSIS — F1010 Alcohol abuse, uncomplicated: Secondary | ICD-10-CM

## 2021-02-08 DIAGNOSIS — E781 Pure hyperglyceridemia: Secondary | ICD-10-CM

## 2021-02-08 MED ORDER — OMEGA-3-ACID ETHYL ESTERS 1 G PO CAPS: 2.0000 g | ORAL_CAPSULE | Freq: Every day | ORAL | 2 refills | Status: DC

## 2021-02-08 NOTE — Progress Notes (Signed)
[place], [place]  [address] Phone:  [phone]   Fax:  [phone] Subjective:   Patient ID: Albert Yoder, male    DOB: 06-27-1970, 50 y.o.   MRN: [id number]  Chief Complaint  Patient presents with   Hypertension    Pt is here for his follow up visit. Pt states he is here for additional blood work. Pt has only been taking his b/p meds for 3 days now.     TARAY NORMOYLE 50 y.o. male with history of hypertension, hypertriglyceridemia and alcohol abuse to the [facility] for additional labs and discussion of previous lab results. Patient states that he has been compliant with medications, but has only been taking them for 3 days. Denies any changes in diet/ exercise habits and/ alcohol usage. Denies any symptoms today.   Working today, scheduled visit during morning break. Denies any other complaints. Denies any fever. Denies any fatigue, chest pain, shortness of breath, HA or dizziness. Denies any blurred vision, numbness or tingling. Denies any abdominal pain, nausea/ vomiting or diarrhea.     Past Medical History:  Diagnosis Date   Hypertension     Past Surgical History:  Procedure Laterality Date   NO PAST SURGERIES      Family History  Problem Relation Age of Onset   Hypertension Mother    Diabetes Neg Hx    Early death Neg Hx    Heart disease Neg Hx    Hyperlipidemia Neg Hx    Kidney disease Neg Hx    Stroke Neg Hx    Breast cancer Mother     Social History   Socioeconomic History   Marital status: Single    Spouse name: Not on file   Number of children: Not on file   Years of education: Not on file   Highest education level: Not on file  Occupational History   Not on file  Tobacco Use   Smoking status: Some Days    Packs/day: 0.30    Years: 20.00    Pack years: 6.00    Types: Cigarettes   Smokeless tobacco: Never   Tobacco comments:    "off and on"  Vaping Use   Vaping Use: Never used   Substance and Sexual Activity   Alcohol use: Yes    Alcohol/week: 5.0 standard drinks    Types: 5 Cans of beer per week   Drug use: No   Sexual activity: Yes  Other Topics Concern   Not on file  Social History Narrative   Not on file   Social Determinants of Health   Financial Resource Strain: Not on file  Food Insecurity: Not on file  Transportation Needs: Not on file  Physical Activity: Not on file  Stress: Not on file  Social Connections: Not on file  Intimate Partner Violence: Not on file    Outpatient Medications Prior to Visit  Medication Sig Dispense Refill   amLODipine (NORVASC) 10 MG tablet Take 1 tablet (10 mg total) by mouth daily. 30 tablet 0   hydrochlorothiazide (HYDRODIURIL) 25 MG tablet Take 1 tablet (25 mg total) by mouth daily. 90 tablet 3   No facility-administered medications prior to visit.    No Known Allergies  Review of Systems  Constitutional:  Negative for chills, fever and malaise/fatigue.  Respiratory:  Negative for cough and shortness of breath.   Cardiovascular:  Negative for chest pain, palpitations and leg swelling.  Gastrointestinal:  Negative for  abdominal pain, blood in stool, constipation, diarrhea, nausea and vomiting.  Genitourinary: Negative.   Musculoskeletal: Negative.   Skin: Negative.   Neurological: Negative.   Psychiatric/Behavioral:  Negative for depression. The patient is not nervous/anxious.   All other systems reviewed and are negative.     Objective:    Physical Exam Vitals reviewed.  Constitutional:      General: He is not in acute distress.    Appearance: Normal appearance.  HENT:     Head: Normocephalic.     Nose: Nose normal.  Cardiovascular:     Rate and Rhythm: Normal rate and regular rhythm.     Pulses: Normal pulses.     Heart sounds: Normal heart sounds.     Comments: No obvious peripheral edema Pulmonary:     Effort: Pulmonary effort is normal.     Breath sounds: Normal breath sounds.   Musculoskeletal:        General: Normal range of motion.     Cervical back: Normal range of motion and neck supple.  Skin:    General: Skin is warm and dry.     Capillary Refill: Capillary refill takes less than 2 seconds.  Neurological:     General: No focal deficit present.     Mental Status: He is alert and oriented to person, place, and time.  Psychiatric:        Mood and Affect: Mood normal.        Behavior: Behavior normal.        Thought Content: Thought content normal.        Judgment: Judgment normal.    BP (!) 172/106 Comment: manually  Pulse 97   Temp 98.6 F (37 C)   Ht $R'6\' 2"'ob$  (1.88 m)   Wt (!) 302 lb 9.6 oz (137.3 kg)   SpO2 100%   BMI 38.85 kg/m  Wt Readings from Last 3 Encounters:  02/08/21 (!) 302 lb 9.6 oz (137.3 kg)  02/04/21 (!) 305 lb (138.3 kg)  08/17/13 251 lb (113.9 kg)    Immunization History  Administered Date(s) Administered   Tdap 07/12/2011, 11/30/2014    Diabetic Foot Exam - Simple   No data filed     Lab Results  Component Value Date   TSH 0.70 09/02/2012   Lab Results  Component Value Date   WBC 6.2 02/04/2021   HGB 16.6 02/04/2021   HCT 48.1 02/04/2021   MCV 90 02/04/2021   PLT 214 02/04/2021   Lab Results  Component Value Date   NA 140 02/04/2021   K 4.1 02/04/2021   CO2 18 (L) 02/04/2021   GLUCOSE 94 02/04/2021   BUN 14 02/04/2021   CREATININE 0.94 02/04/2021   BILITOT 0.3 02/04/2021   ALKPHOS 76 02/04/2021   AST 52 (H) 02/04/2021   ALT 54 (H) 02/04/2021   PROT 8.0 02/04/2021   ALBUMIN 4.1 02/04/2021   CALCIUM 9.3 02/04/2021   EGFR 100 02/04/2021   GFR 83.62 09/02/2012   Lab Results  Component Value Date   CHOL 190 02/04/2021   CHOL 189 09/02/2012   Lab Results  Component Value Date   HDL 33 (L) 02/04/2021   HDL 40.60 09/02/2012   Lab Results  Component Value Date   LDLCALC 46 02/04/2021   Lab Results  Component Value Date   TRIG 789 (HH) 02/04/2021   TRIG (H) 09/02/2012    578.0 Triglyceride  is over 400; calculations on Lipids are invalid.   Lab Results  Component Value  Date   CHOLHDL 5.8 (H) 02/04/2021   CHOLHDL 5 09/02/2012   Lab Results  Component Value Date   HGBA1C 5.5 02/04/2021       Assessment & Plan:   Problem List Items Addressed This Visit   None Visit Diagnoses     Lipids abnormal    -  Primary   Relevant Orders   Lipase ordered Discussed lab results at length   Hypertriglyceridemia       Relevant Medications   omega-3 acid ethyl esters (LOVAZA) 1 g capsule, ordered as initial treatment of abnormal triglycerides, will reevaluate in the 3 mths to determine if additional treatment needed Encouraged continued diet and exercise efforts  Encouraged continued compliance with medication     Alcohol abuse     Discussed the risk of continued alcohol usage Encouraged alcohol cessation   Maintain existing follow up on 11/11, sooner as needed    I am having Harrell Gave D. Dehoyos start on omega-3 acid ethyl esters. I am also having him maintain his amLODipine and hydrochlorothiazide.  Meds ordered this encounter  Medications   omega-3 acid ethyl esters (LOVAZA) 1 g capsule    Sig: Take 2 capsules (2 g total) by mouth daily.    Dispense:  60 capsule    Refill:  2     Teena Dunk, NP

## 2021-02-08 NOTE — Telephone Encounter (Signed)
Discuss his appt today with Wife Marcelino Duster [phone]

## 2021-02-09 LAB — LIPASE: Lipase: 27 U/L (ref 13–78)

## 2021-03-08 ENCOUNTER — Encounter: Payer: Self-pay | Admitting: Nurse Practitioner

## 2021-03-08 ENCOUNTER — Ambulatory Visit (INDEPENDENT_AMBULATORY_CARE_PROVIDER_SITE_OTHER): Payer: Self-pay | Admitting: Nurse Practitioner

## 2021-03-08 ENCOUNTER — Other Ambulatory Visit: Payer: Self-pay

## 2021-03-08 VITALS — BP 164/102 | HR 107 | Temp 99.3°F | Ht 74.0 in | Wt 302.8 lb

## 2021-03-08 DIAGNOSIS — I10 Essential (primary) hypertension: Secondary | ICD-10-CM

## 2021-03-08 DIAGNOSIS — E7849 Other hyperlipidemia: Secondary | ICD-10-CM

## 2021-03-08 DIAGNOSIS — I160 Hypertensive urgency: Secondary | ICD-10-CM

## 2021-03-08 LAB — POCT URINALYSIS DIP (CLINITEK)
Bilirubin, UA: NEGATIVE
Blood, UA: NEGATIVE
Glucose, UA: NEGATIVE mg/dL
Ketones, POC UA: NEGATIVE mg/dL
Leukocytes, UA: NEGATIVE
Nitrite, UA: NEGATIVE
POC PROTEIN,UA: NEGATIVE
Spec Grav, UA: >=1.03 — AB (ref 1.010–1.025)
Urobilinogen, UA: 4 E.U./dL — AB
pH, UA: 5.5 (ref 5.0–8.0)

## 2021-03-08 MED ORDER — AMLODIPINE BESYLATE 10 MG PO TABS: 10.0000 mg | ORAL_TABLET | Freq: Every day | ORAL | 0 refills | Status: DC

## 2021-03-08 NOTE — Progress Notes (Signed)
[facility] Patient [facility] [address] [place], [place]  [address] Phone:  [phone]   Fax:  [phone] Subjective:   Patient ID: Albert Yoder, male    DOB: [birth year], 50 y.o.   MRN: [id number]  Chief Complaint  Patient presents with   Follow-up    1 month follow up; pt requested refill on medications;   HPI Albert Yoder 50 y.o. male with history of hyperlipidemia and hypertension to the [facility] for follow up of hypertension and hyperlipidemia. Wife at bedside. Patient states that he has been compliant with all medications and no longer has headaches as discussed in previous visit.   When questioned about B/P, denies any increased stressors and states that he feels that his overall health has improved since previous visit. Alcohol consumption has decreased, only drinking 2 beers/ day and less alcohol on the weekend. Changed overall diet habits, eating more fruits and vegetables, consumes 3 bananas every morning. Wife agrees that her husband overall quality of life has improved in the past month.   Denies any symptoms today. Denies any fever. Denies any fatigue, chest pain, shortness of breath, HA or dizziness. Denies any blurred vision, numbness or tingling.   Past Medical History:  Diagnosis Date   Hypertension     Past Surgical History:  Procedure Laterality Date   NO PAST SURGERIES      Family History  Problem Relation Age of Onset   Hypertension Mother    Diabetes Neg Hx    Early death Neg Hx    Heart disease Neg Hx    Hyperlipidemia Neg Hx    Kidney disease Neg Hx    Stroke Neg Hx    Breast cancer Mother     Social History   Socioeconomic History   Marital status: Single    Spouse name: Not on file   Number of children: Not on file   Years of education: Not on file   Highest education level: Not on file  Occupational History   Not on file  Tobacco Use   Smoking status: Some Days    Packs/day: 0.30    Years: 20.00    Pack  years: 6.00    Types: Cigarettes   Smokeless tobacco: Never   Tobacco comments:    "off and on"  Vaping Use   Vaping Use: Never used  Substance and Sexual Activity   Alcohol use: Yes    Alcohol/week: 5.0 standard drinks    Types: 5 Cans of beer per week   Drug use: No   Sexual activity: Yes  Other Topics Concern   Not on file  Social History Narrative   Not on file   Social Determinants of Health   Financial Resource Strain: Not on file  Food Insecurity: Not on file  Transportation Needs: Not on file  Physical Activity: Not on file  Stress: Not on file  Social Connections: Not on file  Intimate Partner Violence: Not on file    Outpatient Medications Prior to Visit  Medication Sig Dispense Refill   hydrochlorothiazide (HYDRODIURIL) 25 MG tablet Take 1 tablet (25 mg total) by mouth daily. 90 tablet 3   omega-3 acid ethyl esters (LOVAZA) 1 g capsule Take 2 capsules (2 g total) by mouth daily. 60 capsule 2   amLODipine (NORVASC) 10 MG tablet Take 1 tablet (10 mg total) by mouth daily. 30 tablet 0   No facility-administered medications prior to visit.    No Known Allergies  Review of Systems  Constitutional:  Negative for chills, fever and malaise/fatigue.  HENT: Negative.    Eyes: Negative.   Respiratory:  Negative for cough and shortness of breath.   Cardiovascular:  Negative for chest pain, palpitations and leg swelling.  Gastrointestinal:  Negative for abdominal pain, blood in stool, constipation, diarrhea, nausea and vomiting.  Genitourinary: Negative.   Musculoskeletal: Negative.   Skin: Negative.   Neurological: Negative.   Psychiatric/Behavioral:  Negative for depression. The patient is not nervous/anxious.   All other systems reviewed and are negative.     Objective:    Physical Exam Vitals reviewed.  Constitutional:      General: He is not in acute distress.    Appearance: Normal appearance.  HENT:     Head: Normocephalic.  Cardiovascular:      Rate and Rhythm: Normal rate and regular rhythm.     Pulses: Normal pulses.     Heart sounds: Normal heart sounds.     Comments: No obvious peripheral edema Pulmonary:     Effort: Pulmonary effort is normal.     Breath sounds: Normal breath sounds.  Skin:    General: Skin is warm and dry.     Capillary Refill: Capillary refill takes less than 2 seconds.  Neurological:     General: No focal deficit present.     Mental Status: He is alert and oriented to person, place, and time.  Psychiatric:        Mood and Affect: Mood normal.        Behavior: Behavior normal.        Thought Content: Thought content normal.        Judgment: Judgment normal.    BP (!) 164/102 (BP Location: Left Arm, Cuff Size: Large)   Pulse (!) 107   Temp 99.3 F (37.4 C) (Temporal)   Ht $R'6\' 2"'kg$  (1.88 m)   Wt (!) 302 lb 12.8 oz (137.3 kg)   SpO2 99%   BMI 38.88 kg/m  Wt Readings from Last 3 Encounters:  03/08/21 (!) 302 lb 12.8 oz (137.3 kg)  02/08/21 (!) 302 lb 9.6 oz (137.3 kg)  02/04/21 (!) 305 lb (138.3 kg)    Immunization History  Administered Date(s) Administered   Tdap 07/12/2011, 11/30/2014    Diabetic Foot Exam - Simple   No data filed     Lab Results  Component Value Date   TSH 0.70 09/02/2012   Lab Results  Component Value Date   WBC 6.2 02/04/2021   HGB 16.6 02/04/2021   HCT 48.1 02/04/2021   MCV 90 02/04/2021   PLT 214 02/04/2021   Lab Results  Component Value Date   NA 140 02/04/2021   K 4.1 02/04/2021   CO2 18 (L) 02/04/2021   GLUCOSE 94 02/04/2021   BUN 14 02/04/2021   CREATININE 0.94 02/04/2021   BILITOT 0.3 02/04/2021   ALKPHOS 76 02/04/2021   AST 52 (H) 02/04/2021   ALT 54 (H) 02/04/2021   PROT 8.0 02/04/2021   ALBUMIN 4.1 02/04/2021   CALCIUM 9.3 02/04/2021   EGFR 100 02/04/2021   GFR 83.62 09/02/2012   Lab Results  Component Value Date   CHOL 190 02/04/2021   CHOL 189 09/02/2012   Lab Results  Component Value Date   HDL 33 (L) 02/04/2021   HDL 40.60  09/02/2012   Lab Results  Component Value Date   LDLCALC 46 02/04/2021   Lab Results  Component Value Date   TRIG 789 ([place]) 02/04/2021  TRIG (H) 09/02/2012    578.0 Triglyceride is over 400; calculations on Lipids are invalid.   Lab Results  Component Value Date   CHOLHDL 5.8 (H) 02/04/2021   CHOLHDL 5 09/02/2012   Lab Results  Component Value Date   HGBA1C 5.5 02/04/2021       Assessment & Plan:   Problem List Items Addressed This Visit       Cardiovascular and Mediastinum   Essential hypertension, benign   Relevant Medications   amLODipine (NORVASC) 10 MG tablet   Other Relevant Orders   POCT URINALYSIS DIP (CLINITEK) (Completed)   Other Visit Diagnoses     Other hyperlipidemia    -  Primary   Relevant Medications   amLODipine (NORVASC) 10 MG tablet   Other Relevant Orders   Lipid panel, completed to evaluate any improvement in triglycerides   Hypertensive urgency       Relevant Medications   amLODipine (NORVASC) 10 MG tablet  Given patient's improved symptoms and recent changes, will reevaluate B/P medications at follow up.   Suspect patient may suffer from 'white coat syndrome'. Requested that he record his B/P twice a day for the next two and bring to follow up appointment. Norvasc refilled. Informed to continue taking B/P medications as prescribed until follow up.   Reassuring that patient denies any symptoms today and that chronic symptoms r/t to elevated B/P have resolved.   Follow up in 2 wks for reevaluation of B/P medications, sooner as needed    I am having Albert Yoder maintain his hydrochlorothiazide, omega-3 acid ethyl esters, and amLODipine.  Meds ordered this encounter  Medications   amLODipine (NORVASC) 10 MG tablet    Sig: Take 1 tablet (10 mg total) by mouth daily.    Dispense:  30 tablet    Refill:  0     Teena Dunk, NP

## 2021-03-08 NOTE — Patient Instructions (Signed)
You were seen today in the [facility] for reevaluation of B/P and cholesterol. Labs were collected, results will be available via MyChart or, if abnormal, you will be contacted by clinic staff. You were prescribed medications, please take as directed. Please follow up in 2 wks B/P check.

## 2021-03-09 LAB — LIPID PANEL
Chol/HDL Ratio: 5.5 ratio — ABNORMAL HIGH (ref 0.0–5.0)
Cholesterol, Total: 192 mg/dL (ref 100–199)
HDL: 35 mg/dL — ABNORMAL LOW (ref >39)
LDL Chol Calc (NIH): 123 mg/dL — ABNORMAL HIGH (ref 0–99)
Triglycerides: 188 mg/dL — ABNORMAL HIGH (ref 0–149)
VLDL Cholesterol Cal: 34 mg/dL (ref 5–40)

## 2021-03-25 ENCOUNTER — Ambulatory Visit: Payer: Self-pay | Admitting: Nurse Practitioner

## 2021-03-29 ENCOUNTER — Ambulatory Visit: Payer: Self-pay | Admitting: Nurse Practitioner

## 2021-04-01 ENCOUNTER — Telehealth: Payer: Self-pay

## 2021-04-01 DIAGNOSIS — I10 Essential (primary) hypertension: Secondary | ICD-10-CM

## 2021-04-01 NOTE — Telephone Encounter (Signed)
BP refill

## 2021-04-02 ENCOUNTER — Ambulatory Visit: Payer: Self-pay | Admitting: Nurse Practitioner

## 2021-04-02 MED ORDER — AMLODIPINE BESYLATE 10 MG PO TABS: 10.0000 mg | ORAL_TABLET | Freq: Every day | ORAL | 0 refills | Status: DC

## 2021-04-02 NOTE — Telephone Encounter (Signed)
Amlodipine refills sent.

## 2021-04-12 ENCOUNTER — Ambulatory Visit: Payer: Self-pay | Admitting: Nurse Practitioner

## 2021-04-17 ENCOUNTER — Encounter: Payer: Self-pay | Admitting: Nurse Practitioner

## 2021-04-17 ENCOUNTER — Ambulatory Visit (INDEPENDENT_AMBULATORY_CARE_PROVIDER_SITE_OTHER): Payer: Self-pay | Admitting: Nurse Practitioner

## 2021-04-17 ENCOUNTER — Other Ambulatory Visit: Payer: Self-pay

## 2021-04-17 VITALS — BP 180/102 | HR 101 | Ht 74.0 in | Wt 307.0 lb

## 2021-04-17 DIAGNOSIS — E781 Pure hyperglyceridemia: Secondary | ICD-10-CM

## 2021-04-17 DIAGNOSIS — I10 Essential (primary) hypertension: Secondary | ICD-10-CM

## 2021-04-17 DIAGNOSIS — R000 Tachycardia, unspecified: Secondary | ICD-10-CM

## 2021-04-17 MED ORDER — LOSARTAN POTASSIUM-HCTZ 50-12.5 MG PO TABS: 1.0000 | ORAL_TABLET | Freq: Every day | ORAL | 2 refills | Status: DC

## 2021-04-17 MED ORDER — METOPROLOL SUCCINATE ER 25 MG PO TB24: 25.0000 mg | ORAL_TABLET | Freq: Every day | ORAL | 2 refills | Status: DC

## 2021-04-17 MED ORDER — AMLODIPINE BESYLATE 10 MG PO TABS: 10.0000 mg | ORAL_TABLET | Freq: Every day | ORAL | 2 refills | Status: DC

## 2021-04-17 MED ORDER — OMEGA-3-ACID ETHYL ESTERS 1 G PO CAPS: 2.0000 g | ORAL_CAPSULE | Freq: Every day | ORAL | 2 refills | Status: DC

## 2021-04-17 NOTE — Patient Instructions (Signed)
You were seen today in the [facility] for reevaluation of B/P.  You were prescribed medications, please take as directed. Please follow up in 3 mths for reevaluation.  

## 2021-04-17 NOTE — Progress Notes (Signed)
Albert Yoder, [place]  [address] Phone:  [phone]   Fax:  [phone] Subjective:   Patient ID: Albert Yoder, male    DOB: 1970-06-19, 50 y.o.   MRN: [id number]  Chief Complaint  Patient presents with   Follow-up    Hypertension.   HPI Albert Yoder 50 y.o. male  has a past medical history of Hypertension. To the [facility] for reevaluation of B/P.  Patient states that he has been compliant with medications, but has had some difficulties managing diet due to hectic work schedule. Continues to only drink alcohol in moderation. States that B/P at home is improving, but still elevated, typically around 150/98.  Denies any other complaints today. Denies any fatigue, chest pain, shortness of breath, HA or dizziness. Denies any blurred vision, numbness or tingling.  Past Medical History:  Diagnosis Date   Hypertension     Past Surgical History:  Procedure Laterality Date   NO PAST SURGERIES      Family History  Problem Relation Age of Onset   Hypertension Mother    Diabetes Neg Hx    Early death Neg Hx    Heart disease Neg Hx    Hyperlipidemia Neg Hx    Kidney disease Neg Hx    Stroke Neg Hx    Breast cancer Mother     Social History   Socioeconomic History   Marital status: Single    Spouse name: Not on file   Number of children: Not on file   Years of education: Not on file   Highest education level: Not on file  Occupational History   Not on file  Tobacco Use   Smoking status: Some Days    Packs/day: 0.30    Years: 20.00    Pack years: 6.00    Types: Cigarettes   Smokeless tobacco: Never   Tobacco comments:    "off and on"  Vaping Use   Vaping Use: Never used  Substance and Sexual Activity   Alcohol use: Yes    Alcohol/week: 5.0 standard drinks    Types: 5 Cans of beer per week   Drug use: No   Sexual activity: Yes  Other Topics Concern   Not on file  Social History Narrative   Not on file    Social Determinants of Health   Financial Resource Strain: Not on file  Food Insecurity: Not on file  Transportation Needs: Not on file  Physical Activity: Not on file  Stress: Not on file  Social Connections: Not on file  Intimate Partner Violence: Not on file    Outpatient Medications Prior to Visit  Medication Sig Dispense Refill   amLODipine (NORVASC) 10 MG tablet Take 1 tablet (10 mg total) by mouth daily. 30 tablet 0   hydrochlorothiazide (HYDRODIURIL) 25 MG tablet Take 1 tablet (25 mg total) by mouth daily. 90 tablet 3   omega-3 acid ethyl esters (LOVAZA) 1 g capsule Take 2 capsules (2 g total) by mouth daily. 60 capsule 2   No facility-administered medications prior to visit.    No Known Allergies  Review of Systems  Constitutional:  Negative for chills, fever and malaise/fatigue.  Eyes: Negative.   Respiratory:  Negative for cough and shortness of breath.   Cardiovascular:  Negative for chest pain, palpitations and leg swelling.  Gastrointestinal:  Negative for abdominal pain, blood in stool, constipation, diarrhea, nausea and vomiting.  Skin: Negative.   Psychiatric/Behavioral:  Negative for  depression. The patient is not nervous/anxious.   All other systems reviewed and are negative.     Objective:    Physical Exam Vitals reviewed.  Constitutional:      General: He is not in acute distress.    Appearance: Normal appearance. He is obese.  HENT:     Head: Normocephalic.  Eyes:     Extraocular Movements: Extraocular movements intact.     Conjunctiva/sclera: Conjunctivae normal.     Pupils: Pupils are equal, round, and reactive to light.  Cardiovascular:     Rate and Rhythm: Normal rate and regular rhythm.     Pulses: Normal pulses.     Heart sounds: Normal heart sounds.     Comments: No obvious peripheral edema Pulmonary:     Effort: Pulmonary effort is normal.     Breath sounds: Normal breath sounds.  Musculoskeletal:     Cervical back: Normal range  of motion and neck supple.  Skin:    General: Skin is warm and dry.     Capillary Refill: Capillary refill takes less than 2 seconds.  Neurological:     General: No focal deficit present.     Mental Status: He is alert and oriented to person, place, and time.  Psychiatric:        Mood and Affect: Mood normal.        Behavior: Behavior normal.        Thought Content: Thought content normal.        Judgment: Judgment normal.    BP (!) 180/102 (BP Location: Left Arm, Patient Position: Sitting)    Pulse (!) 101    Ht _0  (1.88 m)    Wt (!) 307 lb 0.2 oz (139.3 kg)    SpO2 99%    BMI 39.42 kg/m  Wt Readings from Last 3 Encounters:  04/17/21 (!) 307 lb 0.2 oz (139.3 kg)  03/08/21 (!) 302 lb 12.8 oz (137.3 kg)  02/08/21 (!) 302 lb 9.6 oz (137.3 kg)    Immunization History  Administered Date(s) Administered   Tdap 07/12/2011, 11/30/2014    Diabetic Foot Exam - Simple   No data filed     Lab Results  Component Value Date   TSH 0.70 09/02/2012   Lab Results  Component Value Date   WBC 6.2 02/04/2021   HGB 16.6 02/04/2021   HCT 48.1 02/04/2021   MCV 90 02/04/2021   PLT 214 02/04/2021   Lab Results  Component Value Date   NA 140 02/04/2021   K 4.1 02/04/2021   CO2 18 (L) 02/04/2021   GLUCOSE 94 02/04/2021   BUN 14 02/04/2021   CREATININE 0.94 02/04/2021   BILITOT 0.3 02/04/2021   ALKPHOS 76 02/04/2021   AST 52 (H) 02/04/2021   ALT 54 (H) 02/04/2021   PROT 8.0 02/04/2021   ALBUMIN 4.1 02/04/2021   CALCIUM 9.3 02/04/2021   EGFR 100 02/04/2021   GFR 83.62 09/02/2012   Lab Results  Component Value Date   CHOL 192 03/08/2021   CHOL 190 02/04/2021   CHOL 189 09/02/2012   Lab Results  Component Value Date   HDL 35 (L) 03/08/2021   HDL 33 (L) 02/04/2021   HDL 40.60 09/02/2012   Lab Results  Component Value Date   LDLCALC 123 (H) 03/08/2021   LDLCALC 46 02/04/2021   Lab Results  Component Value Date   TRIG 188 (H) 03/08/2021   TRIG 789 (HH) 02/04/2021    TRIG (H) 09/02/2012  578.0 Triglyceride is over 400; calculations on Lipids are invalid.   Lab Results  Component Value Date   CHOLHDL 5.5 (H) 03/08/2021   CHOLHDL 5.8 (H) 02/04/2021   CHOLHDL 5 09/02/2012   Lab Results  Component Value Date   HGBA1C 5.5 02/04/2021       Assessment & Plan:   Problem List Items Addressed This Visit       Cardiovascular and Mediastinum   Essential hypertension, benign   Relevant Medications   amLODipine (NORVASC) 10 MG tablet   losartan-hydrochlorothiazide (HYZAAR) 50-12.5 MG tablet   omega-3 acid ethyl esters (LOVAZA) 1 g capsule   metoprolol succinate (TOPROL-XL) 25 MG 24 hr tablet Encouraged continued diet and exercise efforts  Encouraged continued compliance with medication     Other Visit Diagnoses     Tachycardia    -  Primary   Relevant Medications   metoprolol succinate (TOPROL-XL) 25 MG 24 hr tablet   Hypertriglyceridemia       Relevant Medications   amLODipine (NORVASC) 10 MG tablet   losartan-hydrochlorothiazide (HYZAAR) 50-12.5 MG tablet   omega-3 acid ethyl esters (LOVAZA) 1 g capsule   metoprolol succinate (TOPROL-XL) 25 MG 24 hr tablet   Follow up in 3 mths for reevaluation of B/P, sooner as needed    I have discontinued Harrell Gave D. Yan's hydrochlorothiazide. I am also having him start on losartan-hydrochlorothiazide and metoprolol succinate. Additionally, I am having him maintain his amLODipine and omega-3 acid ethyl esters.  Meds ordered this encounter  Medications   amLODipine (NORVASC) 10 MG tablet    Sig: Take 1 tablet (10 mg total) by mouth daily.    Dispense:  30 tablet    Refill:  2   losartan-hydrochlorothiazide (HYZAAR) 50-12.5 MG tablet    Sig: Take 1 tablet by mouth daily.    Dispense:  30 tablet    Refill:  2   omega-3 acid ethyl esters (LOVAZA) 1 g capsule    Sig: Take 2 capsules (2 g total) by mouth daily.    Dispense:  60 capsule    Refill:  2   metoprolol succinate (TOPROL-XL) 25 MG 24  hr tablet    Sig: Take 1 tablet (25 mg total) by mouth daily.    Dispense:  30 tablet    Refill:  2     Teena Dunk, NP

## 2021-07-16 ENCOUNTER — Ambulatory Visit: Payer: Self-pay | Admitting: Nurse Practitioner

## 2021-07-23 ENCOUNTER — Ambulatory Visit (INDEPENDENT_AMBULATORY_CARE_PROVIDER_SITE_OTHER): Payer: Self-pay | Admitting: Nurse Practitioner

## 2021-07-23 ENCOUNTER — Other Ambulatory Visit: Payer: Self-pay

## 2021-07-23 ENCOUNTER — Encounter: Payer: Self-pay | Admitting: Nurse Practitioner

## 2021-07-23 VITALS — BP 200/118 | HR 88 | Temp 98.7°F | Ht 74.0 in | Wt 306.5 lb

## 2021-07-23 DIAGNOSIS — M2559 Pain in other specified joint: Secondary | ICD-10-CM

## 2021-07-23 DIAGNOSIS — I10 Essential (primary) hypertension: Secondary | ICD-10-CM

## 2021-07-23 MED ORDER — DICLOFENAC SODIUM 1 % EX GEL: 4.0000 g | Freq: Four times a day (QID) | CUTANEOUS | 0 refills | Status: AC | PRN

## 2021-07-23 MED ORDER — CYCLOBENZAPRINE HCL 10 MG PO TABS: 10.0000 mg | ORAL_TABLET | Freq: Three times a day (TID) | ORAL | 0 refills | Status: DC | PRN

## 2021-07-23 MED ORDER — CLONIDINE HCL 0.1 MG PO TABS: 0.1000 mg | ORAL_TABLET | Freq: Every day | ORAL | 0 refills | Status: DC

## 2021-07-23 NOTE — Progress Notes (Signed)
? ?[address] ?[place], [place]  [address] ?Phone:  [phone]   Fax:  [phone] ?Subjective:  ? Patient ID: Albert Yoder, male    DOB: 1970-10-09, 51 y.o.   MRN: [id number] ? ?Chief Complaint  ?Patient presents with  ? Follow-up  ?  Pt is here for 3 months follow up. Pt stated he has body aches for the past month.  ? ?HPI ?GWYN MEHRING 51 y.o. male  has a past medical history of Hypertension. To the [facility] for reevaluation of B/P. ? ?Hypertension: Patient here for follow-up of elevated blood pressure. He is not exercising and is adherent to low salt diet.  Blood pressure is not well controlled at home. Cardiac symptoms none. Patient denies chest pain, dyspnea, fatigue, lower extremity edema, palpitations, and paroxysmal nocturnal dyspnea.  Cardiovascular risk factors: hypertension, male gender, and obesity (BMI >= 30 kg/m2). Use of agents associated with hypertension: none. History of target organ damage: none. B/P at home 159/95.  ? ?Concerned today for pain in right shoulder and bilateral knees x 1 mth. Pain is the most pronounced at night. Has been taking Tylenol PM with only mild to moderate improvement. Has been taking ibuprofen intermittently with only temporary improvement. Currently rates pain 5/10, with it being the most pronounced in the morning.  ? ?Verbalizes increased intake of alcohol and shots on the weekend. Has not been monitoring meals or exercising and endorses increased hours at work.  ? ?Denies any other concerns today. Denies any fever. Denies any fatigue, chest pain, shortness of breath, HA or dizziness. Denies any blurred vision, numbness or tingling. ? ?Past Medical History:  ?Diagnosis Date  ? Hypertension   ? ? ?Past Surgical History:  ?Procedure Laterality Date  ? NO PAST SURGERIES    ? ? ?Family History  ?Problem Relation Age of Onset  ? Hypertension Mother   ? Diabetes Neg Hx   ? Early death Neg Hx   ? Heart disease Neg Hx   ?  Hyperlipidemia Neg Hx   ? Kidney disease Neg Hx   ? Stroke Neg Hx   ? Breast cancer Mother   ? ? ?Social History  ? ?Socioeconomic History  ? Marital status: Single  ?  Spouse name: Not on file  ? Number of children: Not on file  ? Years of education: Not on file  ? Highest education level: Not on file  ?Occupational History  ? Not on file  ?Tobacco Use  ? Smoking status: Some Days  ?  Packs/day: 0.30  ?  Years: 20.00  ?  Pack years: 6.00  ?  Types: Cigarettes  ? Smokeless tobacco: Never  ? Tobacco comments:  ?  "off and on"  ?Vaping Use  ? Vaping Use: Never used  ?Substance and Sexual Activity  ? Alcohol use: Yes  ?  Alcohol/week: 5.0 standard drinks  ?  Types: 5 Cans of beer per week  ? Drug use: No  ? Sexual activity: Yes  ?Other Topics Concern  ? Not on file  ?Social History Narrative  ? Not on file  ? ?Social Determinants of Health  ? ?Financial Resource Strain: Not on file  ?Food Insecurity: Not on file  ?Transportation Needs: Not on file  ?Physical Activity: Not on file  ?Stress: Not on file  ?Social Connections: Not on file  ?Intimate Partner Violence: Not on file  ? ? ?Outpatient Medications Prior to Visit  ?Medication Sig Dispense Refill  ?  amLODipine (NORVASC) 10 MG tablet Take 1 tablet (10 mg total) by mouth daily. 30 tablet 2  ? losartan-hydrochlorothiazide (HYZAAR) 50-12.5 MG tablet Take 1 tablet by mouth daily. 30 tablet 2  ? metoprolol succinate (TOPROL-XL) 25 MG 24 hr tablet Take 1 tablet (25 mg total) by mouth daily. 30 tablet 2  ? omega-3 acid ethyl esters (LOVAZA) 1 g capsule Take 2 capsules (2 g total) by mouth daily. 60 capsule 2  ? ?No facility-administered medications prior to visit.  ? ? ?No Known Allergies ? ?Review of Systems  ?Constitutional:  Negative for chills, fever and malaise/fatigue.  ?Respiratory:  Negative for cough and shortness of breath.   ?Cardiovascular:  Negative for chest pain, palpitations and leg swelling.  ?Gastrointestinal:  Negative for abdominal pain, blood in stool,  constipation, diarrhea, nausea and vomiting.  ?Musculoskeletal:  Positive for joint pain. Negative for back pain, falls, myalgias and neck pain.  ?Skin: Negative.   ?Neurological: Negative.   ?Psychiatric/Behavioral:  Negative for depression. The patient is not nervous/anxious.   ?All other systems reviewed and are negative. ? ?   ?Objective:  ?  ?Physical Exam ?Vitals reviewed.  ?Constitutional:   ?   General: He is not in acute distress. ?   Appearance: Normal appearance. He is obese.  ?HENT:  ?   Head: Normocephalic.  ?Cardiovascular:  ?   Rate and Rhythm: Normal rate and regular rhythm.  ?   Pulses: Normal pulses.  ?   Heart sounds: Normal heart sounds.  ?   Comments: No obvious peripheral edema ?Pulmonary:  ?   Effort: Pulmonary effort is normal.  ?   Breath sounds: Normal breath sounds.  ?Musculoskeletal:     ?   General: No swelling, tenderness, deformity or signs of injury. Normal range of motion.  ?   Right lower leg: No edema.  ?   Left lower leg: No edema.  ?Skin: ?   General: Skin is warm and dry.  ?   Capillary Refill: Capillary refill takes less than 2 seconds.  ?Neurological:  ?   General: No focal deficit present.  ?   Mental Status: He is alert and oriented to person, place, and time.  ?Psychiatric:     ?   Mood and Affect: Mood normal.     ?   Behavior: Behavior normal.     ?   Thought Content: Thought content normal.     ?   Judgment: Judgment normal.  ? ? ?BP (!) 200/118 (BP Location: Left Arm, Cuff Size: Large)   Pulse 88   Temp 98.7 ?F (37.1 ?C)   Ht $R'6\' 2"'ad$  (1.88 m)   Wt (!) 306 lb 8 oz (139 kg)   SpO2 100%   BMI 39.35 kg/m?  ?Wt Readings from Last 3 Encounters:  ?07/23/21 (!) 306 lb 8 oz (139 kg)  ?04/17/21 (!) 307 lb 0.2 oz (139.3 kg)  ?03/08/21 (!) 302 lb 12.8 oz (137.3 kg)  ? ? ?Immunization History  ?Administered Date(s) Administered  ? Tdap 07/12/2011, 11/30/2014  ? ? ?Diabetic Foot Exam - Simple   ?No data filed ?  ? ? ?Lab Results  ?Component Value Date  ? TSH 0.70 09/02/2012   ? ?Lab Results  ?Component Value Date  ? WBC 5.7 07/23/2021  ? HGB 16.1 07/23/2021  ? HCT 48.4 07/23/2021  ? MCV 90 07/23/2021  ? PLT 219 07/23/2021  ? ?Lab Results  ?Component Value Date  ? NA 140 07/23/2021  ? K  4.4 07/23/2021  ? CO2 23 07/23/2021  ? GLUCOSE 92 07/23/2021  ? BUN 12 07/23/2021  ? CREATININE 1.04 07/23/2021  ? BILITOT 0.6 07/23/2021  ? ALKPHOS 67 07/23/2021  ? AST 39 07/23/2021  ? ALT 31 07/23/2021  ? PROT 8.3 07/23/2021  ? ALBUMIN 4.4 07/23/2021  ? CALCIUM 10.0 07/23/2021  ? EGFR 88 07/23/2021  ? GFR 83.62 09/02/2012  ? ?Lab Results  ?Component Value Date  ? CHOL 184 07/23/2021  ? CHOL 192 03/08/2021  ? CHOL 190 02/04/2021  ? ?Lab Results  ?Component Value Date  ? HDL 37 (L) 07/23/2021  ? HDL 35 (L) 03/08/2021  ? HDL 33 (L) 02/04/2021  ? ?Lab Results  ?Component Value Date  ? LDLCALC 110 (H) 07/23/2021  ? LDLCALC 123 (H) 03/08/2021  ? LDLCALC 46 02/04/2021  ? ?Lab Results  ?Component Value Date  ? TRIG 214 (H) 07/23/2021  ? TRIG 188 (H) 03/08/2021  ? TRIG 789 (HH) 02/04/2021  ? ?Lab Results  ?Component Value Date  ? CHOLHDL 5.0 07/23/2021  ? CHOLHDL 5.5 (H) 03/08/2021  ? CHOLHDL 5.8 (H) 02/04/2021  ? ?Lab Results  ?Component Value Date  ? HGBA1C 5.5 02/04/2021  ? ? ?   ?Assessment & Plan:  ? ?Problem List Items Addressed This Visit   ? ?  ? Cardiovascular and Mediastinum  ? Essential hypertension, benign - Primary  ? Relevant Medications  ? cloNIDine (CATAPRES) 0.1 MG tablet, initiated during today's visit   ? Other Relevant Orders  ? Comprehensive metabolic panel (Completed)  ? Lipid panel (Completed)  ? CBC with Differential/Platelet (Completed) ?Encouraged to take B/P at home twice per day ?Encouraged continued diet and exercise efforts  ?Encouraged continued compliance with medication  ?  ? ?Other Visit Diagnoses   ? ? White coat syndrome with diagnosis of hypertension      ? Relevant Medications  ? cloNIDine (CATAPRES) 0.1 MG tablet  ? Other Relevant Orders  ? Comprehensive metabolic panel  (Completed)  ? Lipid panel (Completed)  ? CBC with Differential/Platelet (Completed)  ? Pain in other joint      ? Relevant Medications  ? diclofenac Sodium (VOLTAREN) 1 % GEL  ? cyclobenzaprine (FLEXERIL) 10 MG tab

## 2021-07-23 NOTE — Patient Instructions (Signed)
You were seen today in the [facility] for follow up of hypertension. Labs were collected, results will be available via MyChart or, if abnormal, you will be contacted by clinic staff. You were prescribed medications, please take as directed. Please follow up in 1 mth for reevaluation.  ?

## 2021-07-24 LAB — LIPID PANEL
Chol/HDL Ratio: 5 ratio (ref 0.0–5.0)
Cholesterol, Total: 184 mg/dL (ref 100–199)
HDL: 37 mg/dL — ABNORMAL LOW (ref >39)
LDL Chol Calc (NIH): 110 mg/dL — ABNORMAL HIGH (ref 0–99)
Triglycerides: 214 mg/dL — ABNORMAL HIGH (ref 0–149)
VLDL Cholesterol Cal: 37 mg/dL (ref 5–40)

## 2021-07-24 LAB — CBC WITH DIFFERENTIAL/PLATELET
Basophils Absolute: 0 10*3/uL (ref 0.0–0.2)
Basos: 1 %
EOS (ABSOLUTE): 0.1 10*3/uL (ref 0.0–0.4)
Eos: 1 %
Hematocrit: 48.4 % (ref 37.5–51.0)
Hemoglobin: 16.1 g/dL (ref 13.0–17.7)
Immature Grans (Abs): 0 10*3/uL (ref 0.0–0.1)
Immature Granulocytes: 0 %
Lymphocytes Absolute: 1.8 10*3/uL (ref 0.7–3.1)
Lymphs: 32 %
MCH: 29.8 pg (ref 26.6–33.0)
MCHC: 33.3 g/dL (ref 31.5–35.7)
MCV: 90 fL (ref 79–97)
Monocytes Absolute: 0.7 10*3/uL (ref 0.1–0.9)
Monocytes: 12 %
Neutrophils Absolute: 3.1 10*3/uL (ref 1.4–7.0)
Neutrophils: 54 %
Platelets: 219 10*3/uL (ref 150–450)
RBC: 5.41 x10E6/uL (ref 4.14–5.80)
RDW: 12.3 % (ref 11.6–15.4)
WBC: 5.7 10*3/uL (ref 3.4–10.8)

## 2021-07-24 LAB — COMPREHENSIVE METABOLIC PANEL
ALT: 31 IU/L (ref 0–44)
AST: 39 IU/L (ref 0–40)
Albumin/Globulin Ratio: 1.1 — ABNORMAL LOW (ref 1.2–2.2)
Albumin: 4.4 g/dL (ref 4.0–5.0)
Alkaline Phosphatase: 67 IU/L (ref 44–121)
BUN/Creatinine Ratio: 12 (ref 9–20)
BUN: 12 mg/dL (ref 6–24)
Bilirubin Total: 0.6 mg/dL (ref 0.0–1.2)
CO2: 23 mmol/L (ref 20–29)
Calcium: 10 mg/dL (ref 8.7–10.2)
Chloride: 101 mmol/L (ref 96–106)
Creatinine, Ser: 1.04 mg/dL (ref 0.76–1.27)
Globulin, Total: 3.9 g/dL (ref 1.5–4.5)
Glucose: 92 mg/dL (ref 70–99)
Potassium: 4.4 mmol/L (ref 3.5–5.2)
Sodium: 140 mmol/L (ref 134–144)
Total Protein: 8.3 g/dL (ref 6.0–8.5)
eGFR: 88 mL/min/{1.73_m2} (ref >59)

## 2021-07-30 ENCOUNTER — Other Ambulatory Visit: Payer: Self-pay | Admitting: Nurse Practitioner

## 2021-07-30 DIAGNOSIS — R000 Tachycardia, unspecified: Secondary | ICD-10-CM

## 2021-07-30 DIAGNOSIS — I10 Essential (primary) hypertension: Secondary | ICD-10-CM

## 2021-07-31 ENCOUNTER — Other Ambulatory Visit: Payer: Self-pay | Admitting: Nurse Practitioner

## 2021-07-31 DIAGNOSIS — E7889 Other lipoprotein metabolism disorders: Secondary | ICD-10-CM

## 2021-07-31 DIAGNOSIS — E781 Pure hyperglyceridemia: Secondary | ICD-10-CM

## 2021-07-31 DIAGNOSIS — I10 Essential (primary) hypertension: Secondary | ICD-10-CM

## 2021-07-31 MED ORDER — OMEGA-3-ACID ETHYL ESTERS 1 G PO CAPS: 2.0000 g | ORAL_CAPSULE | Freq: Every day | ORAL | 2 refills | Status: DC

## 2021-07-31 MED ORDER — ROSUVASTATIN CALCIUM 20 MG PO TABS: 20.0000 mg | ORAL_TABLET | Freq: Every day | ORAL | 11 refills | Status: DC

## 2021-07-31 MED ORDER — AMLODIPINE BESYLATE 10 MG PO TABS: 10.0000 mg | ORAL_TABLET | Freq: Every day | ORAL | 2 refills | Status: DC

## 2021-08-12 ENCOUNTER — Other Ambulatory Visit: Payer: Self-pay | Admitting: Nurse Practitioner

## 2021-08-22 ENCOUNTER — Telehealth: Payer: Self-pay

## 2021-08-23 ENCOUNTER — Other Ambulatory Visit: Payer: Self-pay | Admitting: Nurse Practitioner

## 2021-08-23 DIAGNOSIS — E781 Pure hyperglyceridemia: Secondary | ICD-10-CM

## 2021-08-23 DIAGNOSIS — E7889 Other lipoprotein metabolism disorders: Secondary | ICD-10-CM

## 2021-08-23 DIAGNOSIS — M2559 Pain in other specified joint: Secondary | ICD-10-CM

## 2021-08-23 MED ORDER — ROSUVASTATIN CALCIUM 20 MG PO TABS: 20.0000 mg | ORAL_TABLET | Freq: Every day | ORAL | 11 refills | Status: DC

## 2021-08-23 MED ORDER — OMEGA-3-ACID ETHYL ESTERS 1 G PO CAPS: 2.0000 g | ORAL_CAPSULE | Freq: Every day | ORAL | 2 refills | Status: DC

## 2021-08-23 MED ORDER — PREDNISONE 20 MG PO TABS: 20.0000 mg | ORAL_TABLET | Freq: Every day | ORAL | 0 refills | Status: AC

## 2021-08-23 NOTE — Telephone Encounter (Signed)
Spoke to the patient and advise him of the medication adjustments and add-ons. Patient already has a follow up appt scheduled on Monday, May 1st with you. ?

## 2021-08-26 ENCOUNTER — Encounter: Payer: Self-pay | Admitting: Nurse Practitioner

## 2021-08-26 ENCOUNTER — Ambulatory Visit (INDEPENDENT_AMBULATORY_CARE_PROVIDER_SITE_OTHER): Payer: Self-pay | Admitting: Nurse Practitioner

## 2021-08-26 VITALS — BP 144/98 | HR 101 | Temp 98.4°F | Ht 74.0 in | Wt 302.2 lb

## 2021-08-26 DIAGNOSIS — M2559 Pain in other specified joint: Secondary | ICD-10-CM

## 2021-08-26 DIAGNOSIS — I10 Essential (primary) hypertension: Secondary | ICD-10-CM

## 2021-08-26 NOTE — Patient Instructions (Signed)
You were seen today in the [facility] for reevaluation of hypertension and joint pain. Labs were collected, results will be available via MyChart or, if abnormal, you will be contacted by clinic staff. You were prescribed medications, please take as directed. Please follow up in 1 mth for reevaluation of HTN and joint pain. ?

## 2021-08-26 NOTE — Progress Notes (Signed)
? ?[facility] ?[address], [place]  [address] ?Phone:  [phone]   Fax:  [phone] ?Subjective:  ? Patient ID: Albert Yoder, male    DOB: Sep 19, 1970, 51 y.o.   MRN: [id number] ? ?Chief Complaint  ?Patient presents with  ? Follow-up  ?  Pt is here for 1 month BP follow up. Pt stated his knees sand shoulder, elbow, wrist and fingers hurts. Pt stated he has been in pain for 2 months  ? ?HPI ?Albert Yoder 51 y.o. male  has a past medical history of Hypertension. To the [facility] for reevaluation of HTN and joint pain.  ? ?Patient states that pain has improved since starting prednisone, has two days left of medication.  ? ?Hypertension: Patient here for follow-up of elevated blood pressure. He is exercising and is adherent to low salt diet.  Blood pressure is well controlled at home. Cardiac symptoms none. Patient denies chest pain, claudication, dyspnea, and orthopnea.  Cardiovascular risk factors: hypertension, male gender, and obesity (BMI >= 30 kg/m2). Use of agents associated with hypertension: none. History of target organ damage: none. B/P at home 128/86 and patient has had weight loss since previous visit.  ? ?Denies any other concerns today. Denies any fatigue, chest pain, shortness of breath, HA or dizziness. Denies any blurred vision, numbness or tingling. ? ? ?Past Medical History:  ?Diagnosis Date  ? Hypertension   ? ? ?Past Surgical History:  ?Procedure Laterality Date  ? NO PAST SURGERIES    ? ? ?Family History  ?Problem Relation Age of Onset  ? Hypertension Mother   ? Diabetes Neg Hx   ? Early death Neg Hx   ? Heart disease Neg Hx   ? Hyperlipidemia Neg Hx   ? Kidney disease Neg Hx   ? Stroke Neg Hx   ? Breast cancer Mother   ? ? ?Social History  ? ?Socioeconomic History  ? Marital status: Single  ?  Spouse name: Not on file  ? Number of children: Not on file  ? Years of education: Not on file  ? Highest education level: Not on file  ?Occupational History   ? Not on file  ?Tobacco Use  ? Smoking status: Some Days  ?  Packs/day: 0.30  ?  Years: 20.00  ?  Pack years: 6.00  ?  Types: Cigarettes  ? Smokeless tobacco: Never  ? Tobacco comments:  ?  "off and on"  ?Vaping Use  ? Vaping Use: Never used  ?Substance and Sexual Activity  ? Alcohol use: Yes  ?  Alcohol/week: 5.0 standard drinks  ?  Types: 5 Cans of beer per week  ? Drug use: No  ? Sexual activity: Yes  ?Other Topics Concern  ? Not on file  ?Social History Narrative  ? Not on file  ? ?Social Determinants of Health  ? ?Financial Resource Strain: Not on file  ?Food Insecurity: Not on file  ?Transportation Needs: Not on file  ?Physical Activity: Not on file  ?Stress: Not on file  ?Social Connections: Not on file  ?Intimate Partner Violence: Not on file  ? ? ?Outpatient Medications Prior to Visit  ?Medication Sig Dispense Refill  ? amLODipine (NORVASC) 10 MG tablet Take 1 tablet (10 mg total) by mouth daily. 30 tablet 2  ? cyclobenzaprine (FLEXERIL) 10 MG tablet Take 1 tablet (10 mg total) by mouth 3 (three) times daily as needed for muscle spasms. 30 tablet 0  ? losartan-hydrochlorothiazide (  HYZAAR) 50-12.5 MG tablet Take 1 tablet by mouth once daily 30 tablet 0  ? metoprolol succinate (TOPROL-XL) 25 MG 24 hr tablet Take 1 tablet by mouth once daily 30 tablet 0  ? omega-3 acid ethyl esters (LOVAZA) 1 g capsule Take 2 capsules (2 g total) by mouth daily. 60 capsule 2  ? predniSONE (DELTASONE) 20 MG tablet Take 1 tablet (20 mg total) by mouth daily with breakfast for 5 days. 5 tablet 0  ? rosuvastatin (CRESTOR) 20 MG tablet Take 1 tablet (20 mg total) by mouth daily. 30 tablet 11  ? cloNIDine (CATAPRES) 0.1 MG tablet Take 1 tablet (0.1 mg total) by mouth daily. 30 tablet 0  ? ?No facility-administered medications prior to visit.  ? ? ?No Known Allergies ? ?Review of Systems  ?Constitutional: Negative.  Negative for chills, fever and malaise/fatigue.  ?Respiratory:  Negative for cough and shortness of breath.    ?Cardiovascular:  Negative for chest pain, palpitations and leg swelling.  ?Gastrointestinal:  Negative for abdominal pain, blood in stool, constipation, diarrhea, nausea and vomiting.  ?Musculoskeletal:  Positive for joint pain. Negative for back pain, falls, myalgias and neck pain.  ?Skin: Negative.   ?Neurological: Negative.   ?Psychiatric/Behavioral:  Negative for depression. The patient is not nervous/anxious.   ?All other systems reviewed and are negative. ? ?   ?Objective:  ?  ?Physical Exam ?Vitals reviewed.  ?Constitutional:   ?   General: He is not in acute distress. ?   Appearance: Normal appearance. He is obese.  ?HENT:  ?   Head: Normocephalic.  ?Cardiovascular:  ?   Rate and Rhythm: Normal rate and regular rhythm.  ?   Pulses: Normal pulses.  ?   Heart sounds: Normal heart sounds.  ?   Comments: No obvious peripheral edema ?Pulmonary:  ?   Effort: Pulmonary effort is normal.  ?   Breath sounds: Normal breath sounds.  ?Musculoskeletal:     ?   General: No swelling, tenderness, deformity or signs of injury. Normal range of motion.  ?   Right lower leg: No edema.  ?   Left lower leg: No edema.  ?Skin: ?   General: Skin is warm and dry.  ?   Capillary Refill: Capillary refill takes less than 2 seconds.  ?Neurological:  ?   General: No focal deficit present.  ?   Mental Status: He is alert and oriented to person, place, and time.  ?Psychiatric:     ?   Mood and Affect: Mood normal.     ?   Behavior: Behavior normal.     ?   Thought Content: Thought content normal.     ?   Judgment: Judgment normal.  ? ? ?BP (!) 144/98 (BP Location: Left Arm, Patient Position: Sitting, Cuff Size: Large)   Pulse (!) 101   Temp 98.4 ?F (36.9 ?C)   Ht $R'6\' 2"'bA$  (1.88 m)   Wt (!) 302 lb 3.2 oz (137.1 kg)   SpO2 100%   BMI 38.80 kg/m?  ?Wt Readings from Last 3 Encounters:  ?08/26/21 (!) 302 lb 3.2 oz (137.1 kg)  ?07/23/21 (!) 306 lb 8 oz (139 kg)  ?04/17/21 (!) 307 lb 0.2 oz (139.3 kg)  ? ? ?Immunization History  ?Administered  Date(s) Administered  ? Tdap 07/12/2011, 11/30/2014  ? ? ?Diabetic Foot Exam - Simple   ?No data filed ?  ? ? ?Lab Results  ?Component Value Date  ? TSH 0.70 09/02/2012  ? ?Lab  Results  ?Component Value Date  ? WBC 5.7 07/23/2021  ? HGB 16.1 07/23/2021  ? HCT 48.4 07/23/2021  ? MCV 90 07/23/2021  ? PLT 219 07/23/2021  ? ?Lab Results  ?Component Value Date  ? NA 140 07/23/2021  ? K 4.4 07/23/2021  ? CO2 23 07/23/2021  ? GLUCOSE 92 07/23/2021  ? BUN 12 07/23/2021  ? CREATININE 1.04 07/23/2021  ? BILITOT 0.6 07/23/2021  ? ALKPHOS 67 07/23/2021  ? AST 39 07/23/2021  ? ALT 31 07/23/2021  ? PROT 8.3 07/23/2021  ? ALBUMIN 4.4 07/23/2021  ? CALCIUM 10.0 07/23/2021  ? EGFR 88 07/23/2021  ? GFR 83.62 09/02/2012  ? ?Lab Results  ?Component Value Date  ? CHOL 184 07/23/2021  ? CHOL 192 03/08/2021  ? CHOL 190 02/04/2021  ? ?Lab Results  ?Component Value Date  ? HDL 37 (L) 07/23/2021  ? HDL 35 (L) 03/08/2021  ? HDL 33 (L) 02/04/2021  ? ?Lab Results  ?Component Value Date  ? LDLCALC 110 (H) 07/23/2021  ? LDLCALC 123 (H) 03/08/2021  ? LDLCALC 46 02/04/2021  ? ?Lab Results  ?Component Value Date  ? TRIG 214 (H) 07/23/2021  ? TRIG 188 (H) 03/08/2021  ? TRIG 789 (HH) 02/04/2021  ? ?Lab Results  ?Component Value Date  ? CHOLHDL 5.0 07/23/2021  ? CHOLHDL 5.5 (H) 03/08/2021  ? CHOLHDL 5.8 (H) 02/04/2021  ? ?Lab Results  ?Component Value Date  ? HGBA1C 5.5 02/04/2021  ? ? ?   ?Assessment & Plan:  ? ?Problem List Items Addressed This Visit   ? ?  ? Cardiovascular and Mediastinum  ? Essential hypertension, benign ?Encouraged continued diet and exercise efforts  ?Encouraged continued compliance with medication  ?Encouraged to continue checking B/P regularly at home   ? ?Other Visit Diagnoses   ? ? Pain in other joint    -  Primary  ? Relevant Orders  ? ANA Comprehensive Panel (Completed)  ? Uric Acid (Completed)  ? Rheumatoid factor (Completed) ?Encouraged to complete remaining prednisone dosage  ?Discussed non pharmacological methods for  management of symptoms ?Informed to take OTC medications as needed ?  ? ?Follow up in 1 mth for reevaluation of joint pain and HTN, sooner as needed   ? ? ?I am having Albert Yoder maintain his cloNIDine, cy

## 2021-08-27 LAB — ANA COMPREHENSIVE PANEL
Anti JO-1: <0.2 AI (ref 0.0–0.9)
Centromere Ab Screen: <0.2 AI (ref 0.0–0.9)
Chromatin Ab SerPl-aCnc: <0.2 AI (ref 0.0–0.9)
ENA RNP Ab: <0.2 AI (ref 0.0–0.9)
ENA SM Ab Ser-aCnc: <0.2 AI (ref 0.0–0.9)
ENA SSA (RO) Ab: <0.2 AI (ref 0.0–0.9)
ENA SSB (LA) Ab: <0.2 AI (ref 0.0–0.9)
Scleroderma (Scl-70) (ENA) Antibody, IgG: <0.2 AI (ref 0.0–0.9)
dsDNA Ab: <1 IU/mL (ref 0–9)

## 2021-08-27 LAB — URIC ACID: Uric Acid: 5.5 mg/dL (ref 3.8–8.4)

## 2021-08-27 LAB — RHEUMATOID FACTOR: Rheumatoid fact SerPl-aCnc: <10 IU/mL (ref <14.0)

## 2021-08-28 ENCOUNTER — Other Ambulatory Visit: Payer: Self-pay | Admitting: Nurse Practitioner

## 2021-08-28 DIAGNOSIS — I10 Essential (primary) hypertension: Secondary | ICD-10-CM

## 2021-09-08 ENCOUNTER — Other Ambulatory Visit: Payer: Self-pay | Admitting: Nurse Practitioner

## 2021-09-08 DIAGNOSIS — R000 Tachycardia, unspecified: Secondary | ICD-10-CM

## 2021-09-08 DIAGNOSIS — I10 Essential (primary) hypertension: Secondary | ICD-10-CM

## 2021-09-27 ENCOUNTER — Ambulatory Visit: Payer: Self-pay | Admitting: Nurse Practitioner

## 2021-09-30 ENCOUNTER — Other Ambulatory Visit: Payer: Self-pay | Admitting: Nurse Practitioner

## 2021-09-30 DIAGNOSIS — I10 Essential (primary) hypertension: Secondary | ICD-10-CM

## 2021-10-04 ENCOUNTER — Ambulatory Visit (INDEPENDENT_AMBULATORY_CARE_PROVIDER_SITE_OTHER): Payer: Self-pay | Admitting: Nurse Practitioner

## 2021-10-04 ENCOUNTER — Encounter: Payer: Self-pay | Admitting: Nurse Practitioner

## 2021-10-04 ENCOUNTER — Other Ambulatory Visit: Payer: Self-pay | Admitting: Nurse Practitioner

## 2021-10-04 VITALS — BP 154/94 | HR 84 | Temp 97.3°F | Ht 74.0 in | Wt 308.0 lb

## 2021-10-04 DIAGNOSIS — I10 Essential (primary) hypertension: Secondary | ICD-10-CM

## 2021-10-04 DIAGNOSIS — R000 Tachycardia, unspecified: Secondary | ICD-10-CM

## 2021-10-04 DIAGNOSIS — E7849 Other hyperlipidemia: Secondary | ICD-10-CM

## 2021-10-04 MED ORDER — LOSARTAN POTASSIUM-HCTZ 50-12.5 MG PO TABS: 2.0000 | ORAL_TABLET | Freq: Every day | ORAL | 1 refills | Status: DC

## 2021-10-04 NOTE — Progress Notes (Unsigned)
[address] [place], [place]  [address] Phone:  [phone]   Fax:  [phone] Subjective:   Patient ID: Albert Yoder, male    DOB: [birth year], 51 y.o.   MRN: [id number]  Chief Complaint  Patient presents with   Follow-up    Pt is here for 1 month BP follow up visit.   HPI Albert Yoder 51 y.o. male  has a past medical history of Hypertension. To the [facility] for reevaluation of HTN.  Hypertension: Patient here for follow-up of elevated blood pressure. He is not exercising and is adherent to low salt diet.  Cardiac symptoms none. Patient denies chest pain, claudication, and irregular heart beat.  Cardiovascular risk factors: none. Use of agents associated with hypertension: none. History of target organ damage: none. B/P at home similar to today's.  Denies any other concerns today. Denies any fatigue, chest pain, shortness of breath, HA or dizziness. Denies any blurred vision, numbness or tingling.   Past Medical History:  Diagnosis Date   Hypertension     Past Surgical History:  Procedure Laterality Date   NO PAST SURGERIES      Family History  Problem Relation Age of Onset   Hypertension Mother    Diabetes Neg Hx    Early death Neg Hx    Heart disease Neg Hx    Hyperlipidemia Neg Hx    Kidney disease Neg Hx    Stroke Neg Hx    Breast cancer Mother     Social History   Socioeconomic History   Marital status: Single    Spouse name: Not on file   Number of children: Not on file   Years of education: Not on file   Highest education level: Not on file  Occupational History   Not on file  Tobacco Use   Smoking status: Some Days    Packs/day: 0.30    Years: 20.00    Total pack years: 6.00    Types: Cigarettes   Smokeless tobacco: Never   Tobacco comments:    "off and on"  Vaping Use   Vaping Use: Never used  Substance and Sexual Activity   Alcohol use: Yes    Alcohol/week: 5.0 standard drinks of alcohol     Types: 5 Cans of beer per week   Drug use: No   Sexual activity: Yes  Other Topics Concern   Not on file  Social History Narrative   Not on file   Social Determinants of Health   Financial Resource Strain: Not on file  Food Insecurity: Not on file  Transportation Needs: Not on file  Physical Activity: Not on file  Stress: Not on file  Social Connections: Not on file  Intimate Partner Violence: Not on file    Outpatient Medications Prior to Visit  Medication Sig Dispense Refill   amLODipine (NORVASC) 10 MG tablet Take 1 tablet (10 mg total) by mouth daily. 30 tablet 2   cloNIDine (CATAPRES) 0.1 MG tablet Take 1 tablet by mouth once daily 30 tablet 0   omega-3 acid ethyl esters (LOVAZA) 1 g capsule Take 2 capsules (2 g total) by mouth daily. 60 capsule 2   rosuvastatin (CRESTOR) 20 MG tablet Take 1 tablet (20 mg total) by mouth daily. 30 tablet 11   losartan-hydrochlorothiazide (HYZAAR) 50-12.5 MG tablet Take 1 tablet by mouth once daily 30 tablet 0   metoprolol succinate (TOPROL-XL) 25 MG 24 hr tablet Take 1 tablet by  mouth once daily 30 tablet 0   cyclobenzaprine (FLEXERIL) 10 MG tablet Take 1 tablet (10 mg total) by mouth 3 (three) times daily as needed for muscle spasms. (Patient not taking: Reported on 10/04/2021) 30 tablet 0   No facility-administered medications prior to visit.    No Known Allergies  Review of Systems  Constitutional:  Negative for chills, fever and malaise/fatigue.  Eyes: Negative.   Respiratory:  Negative for cough and shortness of breath.   Cardiovascular:  Negative for chest pain, palpitations and leg swelling.  Gastrointestinal:  Negative for abdominal pain, blood in stool, constipation, diarrhea, nausea and vomiting.  Musculoskeletal: Negative.   Skin: Negative.   Neurological: Negative.   Psychiatric/Behavioral:  Negative for depression. The patient is not nervous/anxious.   All other systems reviewed and are negative.      Objective:     Physical Exam Vitals reviewed.  Constitutional:      General: He is not in acute distress.    Appearance: Normal appearance. He is obese.  HENT:     Head: Normocephalic.  Neck:     Vascular: No carotid bruit.  Cardiovascular:     Rate and Rhythm: Normal rate and regular rhythm.     Pulses: Normal pulses.     Heart sounds: Normal heart sounds.     Comments: No obvious peripheral edema Pulmonary:     Effort: Pulmonary effort is normal.     Breath sounds: Normal breath sounds.  Musculoskeletal:        General: No swelling, tenderness, deformity or signs of injury. Normal range of motion.     Cervical back: Normal range of motion and neck supple. No rigidity or tenderness.     Right lower leg: No edema.     Left lower leg: No edema.  Lymphadenopathy:     Cervical: No cervical adenopathy.  Skin:    General: Skin is warm and dry.     Capillary Refill: Capillary refill takes less than 2 seconds.  Neurological:     General: No focal deficit present.     Mental Status: He is alert and oriented to person, place, and time.  Psychiatric:        Mood and Affect: Mood normal.        Behavior: Behavior normal.        Thought Content: Thought content normal.        Judgment: Judgment normal.     BP (!) 154/94 (BP Location: Right Arm, Patient Position: Sitting, Cuff Size: Large)   Pulse 84   Temp (!) 97.3 F (36.3 C)   Ht $R'6\' 2"'OD$  (1.88 m)   Wt (!) 308 lb (139.7 kg)   SpO2 100%   BMI 39.54 kg/m  Wt Readings from Last 3 Encounters:  10/04/21 (!) 308 lb (139.7 kg)  08/26/21 (!) 302 lb 3.2 oz (137.1 kg)  07/23/21 (!) 306 lb 8 oz (139 kg)    Immunization History  Administered Date(s) Administered   Tdap 07/12/2011, 11/30/2014    Diabetic Foot Exam - Simple   No data filed     Lab Results  Component Value Date   TSH 0.70 09/02/2012   Lab Results  Component Value Date   WBC 5.7 07/23/2021   HGB 16.1 07/23/2021   HCT 48.4 07/23/2021   MCV 90 07/23/2021   PLT 219  07/23/2021   Lab Results  Component Value Date   NA 140 07/23/2021   K 4.4 07/23/2021   CO2 23 07/23/2021  GLUCOSE 92 07/23/2021   BUN 12 07/23/2021   CREATININE 1.04 07/23/2021   BILITOT 0.6 07/23/2021   ALKPHOS 67 07/23/2021   AST 39 07/23/2021   ALT 31 07/23/2021   PROT 8.3 07/23/2021   ALBUMIN 4.4 07/23/2021   CALCIUM 10.0 07/23/2021   EGFR 88 07/23/2021   GFR 83.62 09/02/2012   Lab Results  Component Value Date   CHOL 112 10/04/2021   CHOL 184 07/23/2021   CHOL 192 03/08/2021   Lab Results  Component Value Date   HDL 30 (L) 10/04/2021   HDL 37 (L) 07/23/2021   HDL 35 (L) 03/08/2021   Lab Results  Component Value Date   LDLCALC 50 10/04/2021   LDLCALC 110 (H) 07/23/2021   LDLCALC 123 (H) 03/08/2021   Lab Results  Component Value Date   TRIG 193 (H) 10/04/2021   TRIG 214 (H) 07/23/2021   TRIG 188 (H) 03/08/2021   Lab Results  Component Value Date   CHOLHDL 3.7 10/04/2021   CHOLHDL 5.0 07/23/2021   CHOLHDL 5.5 (H) 03/08/2021   Lab Results  Component Value Date   HGBA1C 5.5 02/04/2021       Assessment & Plan:   Problem List Items Addressed This Visit       Cardiovascular and Mediastinum   Essential hypertension, benign - Primary   Relevant Medications   losartan-hydrochlorothiazide (HYZAAR) 50-12.5 MG tablet, dosage increased Encouraged continued diet and exercise efforts  Encouraged continued compliance with medication  Encouraged to continue checking B/p at home    Other Visit Diagnoses     Other hyperlipidemia       Relevant Medications   losartan-hydrochlorothiazide (HYZAAR) 50-12.5 MG tablet   Other Relevant Orders   Lipid panel (Completed) Encouraged continued diet and exercise efforts  Encouraged continued compliance with medication     Follow up in 3 mths for reevaluation of chronic illness, sooner as needed     I have changed Harrell Gave D. Borge's losartan-hydrochlorothiazide. I am also having him maintain his  cyclobenzaprine, amLODipine, rosuvastatin, omega-3 acid ethyl esters, and cloNIDine.  Meds ordered this encounter  Medications   losartan-hydrochlorothiazide (HYZAAR) 50-12.5 MG tablet    Sig: Take 2 tablets by mouth daily.    Dispense:  180 tablet    Refill:  1     Teena Dunk, NP

## 2021-10-04 NOTE — Patient Instructions (Signed)
You were seen today in the [facility] for reevaluation of chronic illness. Labs were collected, results will be available via MyChart or, if abnormal, you will be contacted by clinic staff. You were prescribed medications, please take as directed. Please follow up in 3 mths for reevaluation of chronic illness  

## 2021-10-05 LAB — LIPID PANEL
Chol/HDL Ratio: 3.7 ratio (ref 0.0–5.0)
Cholesterol, Total: 112 mg/dL (ref 100–199)
HDL: 30 mg/dL — ABNORMAL LOW (ref >39)
LDL Chol Calc (NIH): 50 mg/dL (ref 0–99)
Triglycerides: 193 mg/dL — ABNORMAL HIGH (ref 0–149)
VLDL Cholesterol Cal: 32 mg/dL (ref 5–40)

## 2021-10-31 ENCOUNTER — Other Ambulatory Visit: Payer: Self-pay | Admitting: Nurse Practitioner

## 2021-10-31 DIAGNOSIS — I10 Essential (primary) hypertension: Secondary | ICD-10-CM

## 2021-11-09 ENCOUNTER — Other Ambulatory Visit: Payer: Self-pay | Admitting: Nurse Practitioner

## 2021-11-09 DIAGNOSIS — R000 Tachycardia, unspecified: Secondary | ICD-10-CM

## 2021-11-09 DIAGNOSIS — I10 Essential (primary) hypertension: Secondary | ICD-10-CM

## 2021-12-02 ENCOUNTER — Other Ambulatory Visit: Payer: Self-pay | Admitting: Nurse Practitioner

## 2021-12-02 DIAGNOSIS — I10 Essential (primary) hypertension: Secondary | ICD-10-CM

## 2021-12-09 ENCOUNTER — Telehealth: Payer: Self-pay

## 2021-12-09 NOTE — Telephone Encounter (Signed)
Bp med to be refilled 

## 2021-12-12 ENCOUNTER — Other Ambulatory Visit: Payer: Self-pay | Admitting: Nurse Practitioner

## 2021-12-12 DIAGNOSIS — R000 Tachycardia, unspecified: Secondary | ICD-10-CM

## 2021-12-12 DIAGNOSIS — I10 Essential (primary) hypertension: Secondary | ICD-10-CM

## 2022-01-06 ENCOUNTER — Encounter: Payer: Self-pay | Admitting: Nurse Practitioner

## 2022-01-06 ENCOUNTER — Ambulatory Visit (INDEPENDENT_AMBULATORY_CARE_PROVIDER_SITE_OTHER): Payer: Self-pay | Admitting: Nurse Practitioner

## 2022-01-06 VITALS — BP 131/89 | HR 86 | Temp 96.9°F | Ht 74.0 in | Wt 306.8 lb

## 2022-01-06 DIAGNOSIS — Z6839 Body mass index (BMI) 39.0-39.9, adult: Secondary | ICD-10-CM

## 2022-01-06 DIAGNOSIS — I10 Essential (primary) hypertension: Secondary | ICD-10-CM

## 2022-01-06 DIAGNOSIS — R000 Tachycardia, unspecified: Secondary | ICD-10-CM

## 2022-01-06 DIAGNOSIS — E781 Pure hyperglyceridemia: Secondary | ICD-10-CM

## 2022-01-06 DIAGNOSIS — E7889 Other lipoprotein metabolism disorders: Secondary | ICD-10-CM

## 2022-01-06 DIAGNOSIS — E6609 Other obesity due to excess calories: Secondary | ICD-10-CM

## 2022-01-06 MED ORDER — LOSARTAN POTASSIUM-HCTZ 50-12.5 MG PO TABS: 2.0000 | ORAL_TABLET | Freq: Every day | ORAL | 1 refills | Status: DC

## 2022-01-06 MED ORDER — OMEGA-3-ACID ETHYL ESTERS 1 G PO CAPS: 2.0000 g | ORAL_CAPSULE | Freq: Every day | ORAL | 2 refills | Status: DC

## 2022-01-06 MED ORDER — WEGOVY 0.25 MG/0.5ML ~~LOC~~ SOAJ: 0.2500 mg | SUBCUTANEOUS | 0 refills | Status: AC

## 2022-01-06 MED ORDER — AMLODIPINE BESYLATE 10 MG PO TABS: 10.0000 mg | ORAL_TABLET | Freq: Every day | ORAL | 2 refills | Status: DC

## 2022-01-06 MED ORDER — ROSUVASTATIN CALCIUM 20 MG PO TABS: 20.0000 mg | ORAL_TABLET | Freq: Every day | ORAL | 11 refills | Status: DC

## 2022-01-06 MED ORDER — METOPROLOL SUCCINATE ER 25 MG PO TB24: 25.0000 mg | ORAL_TABLET | Freq: Every day | ORAL | 2 refills | Status: DC

## 2022-01-06 NOTE — Patient Instructions (Addendum)
1. Essential hypertension, benign  - metoprolol succinate (TOPROL-XL) 25 MG 24 hr tablet; Take 1 tablet (25 mg total) by mouth daily.  Dispense: 30 tablet; Refill: 2 - amLODipine (NORVASC) 10 MG tablet; Take 1 tablet (10 mg total) by mouth daily.  Dispense: 30 tablet; Refill: 2 - losartan-hydrochlorothiazide (HYZAAR) 50-12.5 MG tablet; Take 2 tablets by mouth daily.  Dispense: 180 tablet; Refill: 1  2. Tachycardia  - metoprolol succinate (TOPROL-XL) 25 MG 24 hr tablet; Take 1 tablet (25 mg total) by mouth daily.  Dispense: 30 tablet; Refill: 2  3. Hypertriglyceridemia  - omega-3 acid ethyl esters (LOVAZA) 1 g capsule; Take 2 capsules (2 g total) by mouth daily.  Dispense: 60 capsule; Refill: 2  4. Lipids abnormal  - rosuvastatin (CRESTOR) 20 MG tablet; Take 1 tablet (20 mg total) by mouth daily.  Dispense: 30 tablet; Refill: 11  5. Class 2 obesity due to excess calories without serious comorbidity with body mass index (BMI) of 39.0 to 39.9 in adult  - Semaglutide-Weight Management (WEGOVY) 0.25 MG/0.5ML SOAJ; Inject 0.25 mg into the skin once a week.  Dispense: 3 mL; Refill: 0  Follow up:  Follow up in 1 month or sooner if needed

## 2022-01-06 NOTE — Assessment & Plan Note (Signed)
-   metoprolol succinate (TOPROL-XL) 25 MG 24 hr tablet; Take 1 tablet (25 mg total) by mouth daily.  Dispense: 30 tablet; Refill: 2 - amLODipine (NORVASC) 10 MG tablet; Take 1 tablet (10 mg total) by mouth daily.  Dispense: 30 tablet; Refill: 2 - losartan-hydrochlorothiazide (HYZAAR) 50-12.5 MG tablet; Take 2 tablets by mouth daily.  Dispense: 180 tablet; Refill: 1  2. Tachycardia  - metoprolol succinate (TOPROL-XL) 25 MG 24 hr tablet; Take 1 tablet (25 mg total) by mouth daily.  Dispense: 30 tablet; Refill: 2  3. Hypertriglyceridemia  - omega-3 acid ethyl esters (LOVAZA) 1 g capsule; Take 2 capsules (2 g total) by mouth daily.  Dispense: 60 capsule; Refill: 2  4. Lipids abnormal  - rosuvastatin (CRESTOR) 20 MG tablet; Take 1 tablet (20 mg total) by mouth daily.  Dispense: 30 tablet; Refill: 11  5. Class 2 obesity due to excess calories without serious comorbidity with body mass index (BMI) of 39.0 to 39.9 in adult  - Semaglutide-Weight Management (WEGOVY) 0.25 MG/0.5ML SOAJ; Inject 0.25 mg into the skin once a week.  Dispense: 3 mL; Refill: 0  Follow up:  Follow up in 1 month or sooner if needed

## 2022-01-06 NOTE — Progress Notes (Signed)
@Patient  ID: , male    DOB: 19-Jan-1971, 51 y.o.   MRN: [id number]  Chief Complaint  Patient presents with   Follow-up    Pt is here for 3 month BP follow up visit. Pt is requesting a refill on all medication    Referring provider: No ref. provider found   HPI  Albert Yoder 51 y.o. male  has a past medical history of Hypertension.  Hypertension: Patient here for follow-up of elevated blood pressure. He is not exercising and is trying to be adherent to low salt diet.  Blood pressure is well controlled at home. Cardiac symptoms none. Patient denies chest pain, claudication, dyspnea, and orthopnea.  Cardiovascular risk factors: hypertension, male gender, and obesity (BMI >= 30 kg/m2). Use of agents associated with hypertension: none. History of target organ damage: none. Patient would like to try medication for weight loss - will trial wegovy. Denies f/c/s, n/v/d, hemoptysis, PND, leg swelling Denies chest pain or edema      No Known Allergies  Immunization History  Administered Date(s) Administered   Tdap 07/12/2011, 11/30/2014    Past Medical History:  Diagnosis Date   Hypertension     Tobacco History: Social History   Tobacco Use  Smoking Status Some Days   Packs/day: 0.30   Years: 20.00   Total pack years: 6.00   Types: Cigarettes  Smokeless Tobacco Never  Tobacco Comments   "off and on"   Ready to quit: Not Answered Counseling given: Not Answered Tobacco comments: "off and on"   Outpatient Encounter Medications as of 01/06/2022  Medication Sig   cloNIDine (CATAPRES) 0.1 MG tablet Take 1 tablet by mouth once daily   Semaglutide-Weight Management (WEGOVY) 0.25 MG/0.5ML SOAJ Inject 0.25 mg into the skin once a week.   [DISCONTINUED] losartan-hydrochlorothiazide (HYZAAR) 50-12.5 MG tablet Take 2 tablets by mouth daily.   [DISCONTINUED] metoprolol succinate (TOPROL-XL) 25 MG 24 hr tablet Take 1 tablet by mouth once daily   [DISCONTINUED]  rosuvastatin (CRESTOR) 20 MG tablet Take 1 tablet (20 mg total) by mouth daily.   amLODipine (NORVASC) 10 MG tablet Take 1 tablet (10 mg total) by mouth daily.   cyclobenzaprine (FLEXERIL) 10 MG tablet Take 1 tablet (10 mg total) by mouth 3 (three) times daily as needed for muscle spasms. (Patient not taking: Reported on 10/04/2021)   losartan-hydrochlorothiazide (HYZAAR) 50-12.5 MG tablet Take 2 tablets by mouth daily.   metoprolol succinate (TOPROL-XL) 25 MG 24 hr tablet Take 1 tablet (25 mg total) by mouth daily.   omega-3 acid ethyl esters (LOVAZA) 1 g capsule Take 2 capsules (2 g total) by mouth daily.   rosuvastatin (CRESTOR) 20 MG tablet Take 1 tablet (20 mg total) by mouth daily.   [DISCONTINUED] amLODipine (NORVASC) 10 MG tablet Take 1 tablet (10 mg total) by mouth daily.   [DISCONTINUED] omega-3 acid ethyl esters (LOVAZA) 1 g capsule Take 2 capsules (2 g total) by mouth daily.   No facility-administered encounter medications on file as of 01/06/2022.     Review of Systems  Review of Systems  Constitutional: Negative.   HENT: Negative.    Cardiovascular: Negative.   Gastrointestinal: Negative.   Allergic/Immunologic: Negative.   Neurological: Negative.   Psychiatric/Behavioral: Negative.         Physical Exam  BP 131/89 (BP Location: Right Arm, Patient Position: Sitting, Cuff Size: Large)   Pulse 86   Temp (!) 96.9 F (36.1 C)   Ht 6\' 2"  (1.88 m)  Wt (!) 306 lb 12.8 oz (139.2 kg)   SpO2 100%   BMI 39.39 kg/m   Wt Readings from Last 5 Encounters:  01/06/22 (!) 306 lb 12.8 oz (139.2 kg)  10/04/21 (!) 308 lb (139.7 kg)  08/26/21 (!) 302 lb 3.2 oz (137.1 kg)  07/23/21 (!) 306 lb 8 oz (139 kg)  04/17/21 (!) 307 lb 0.2 oz (139.3 kg)     Physical Exam Vitals and nursing note reviewed.  Constitutional:      General: He is not in acute distress.    Appearance: He is well-developed.  Cardiovascular:     Rate and Rhythm: Normal rate and regular rhythm.  Pulmonary:      Effort: Pulmonary effort is normal.     Breath sounds: Normal breath sounds.  Skin:    General: Skin is warm and dry.  Neurological:     Mental Status: He is alert and oriented to person, place, and time.      Lab Results:  CBC    Component Value Date/Time   WBC 5.7 07/23/2021 1457   WBC 7.1 09/02/2012 1601   RBC 5.41 07/23/2021 1457   RBC 5.46 09/02/2012 1601   HGB 16.1 07/23/2021 1457   HCT 48.4 07/23/2021 1457   PLT 219 07/23/2021 1457   MCV 90 07/23/2021 1457   MCH 29.8 07/23/2021 1457   MCH 30.2 07/25/2011 0848   MCHC 33.3 07/23/2021 1457   MCHC 34.6 09/02/2012 1601   RDW 12.3 07/23/2021 1457   LYMPHSABS 1.8 07/23/2021 1457   MONOABS 0.6 09/02/2012 1601   EOSABS 0.1 07/23/2021 1457   BASOSABS 0.0 07/23/2021 1457    BMET    Component Value Date/Time   NA 140 07/23/2021 1457   K 4.4 07/23/2021 1457   CL 101 07/23/2021 1457   CO2 23 07/23/2021 1457   GLUCOSE 92 07/23/2021 1457   GLUCOSE 92 09/02/2012 1601   BUN 12 07/23/2021 1457   CREATININE 1.04 07/23/2021 1457   CALCIUM 10.0 07/23/2021 1457   GFRNONAA 81 (L) 07/12/2011 1510   GFRAA >90 07/12/2011 1510     Assessment & Plan:   Essential hypertension, benign - metoprolol succinate (TOPROL-XL) 25 MG 24 hr tablet; Take 1 tablet (25 mg total) by mouth daily.  Dispense: 30 tablet; Refill: 2 - amLODipine (NORVASC) 10 MG tablet; Take 1 tablet (10 mg total) by mouth daily.  Dispense: 30 tablet; Refill: 2 - losartan-hydrochlorothiazide (HYZAAR) 50-12.5 MG tablet; Take 2 tablets by mouth daily.  Dispense: 180 tablet; Refill: 1  2. Tachycardia  - metoprolol succinate (TOPROL-XL) 25 MG 24 hr tablet; Take 1 tablet (25 mg total) by mouth daily.  Dispense: 30 tablet; Refill: 2  3. Hypertriglyceridemia  - omega-3 acid ethyl esters (LOVAZA) 1 g capsule; Take 2 capsules (2 g total) by mouth daily.  Dispense: 60 capsule; Refill: 2  4. Lipids abnormal  - rosuvastatin (CRESTOR) 20 MG tablet; Take 1 tablet (20  mg total) by mouth daily.  Dispense: 30 tablet; Refill: 11  5. Class 2 obesity due to excess calories without serious comorbidity with body mass index (BMI) of 39.0 to 39.9 in adult  - Semaglutide-Weight Management (WEGOVY) 0.25 MG/0.5ML SOAJ; Inject 0.25 mg into the skin once a week.  Dispense: 3 mL; Refill: 0  Follow up:  Follow up in 1 month or sooner if needed     Ivonne Andrew, NP 01/06/2022

## 2022-01-07 LAB — CBC
Hematocrit: 44.6 % (ref 37.5–51.0)
Hemoglobin: 14.9 g/dL (ref 13.0–17.7)
MCH: 29.9 pg (ref 26.6–33.0)
MCHC: 33.4 g/dL (ref 31.5–35.7)
MCV: 89 fL (ref 79–97)
Platelets: 254 10*3/uL (ref 150–450)
RBC: 4.99 x10E6/uL (ref 4.14–5.80)
RDW: 12.2 % (ref 11.6–15.4)
WBC: 7.1 10*3/uL (ref 3.4–10.8)

## 2022-01-07 LAB — COMPREHENSIVE METABOLIC PANEL
ALT: 31 IU/L (ref 0–44)
AST: 31 IU/L (ref 0–40)
Albumin/Globulin Ratio: 1.2 (ref 1.2–2.2)
Albumin: 4.5 g/dL (ref 4.1–5.1)
Alkaline Phosphatase: 65 IU/L (ref 44–121)
BUN/Creatinine Ratio: 12 (ref 9–20)
BUN: 15 mg/dL (ref 6–24)
Bilirubin Total: 0.6 mg/dL (ref 0.0–1.2)
CO2: 23 mmol/L (ref 20–29)
Calcium: 9.8 mg/dL (ref 8.7–10.2)
Chloride: 101 mmol/L (ref 96–106)
Creatinine, Ser: 1.24 mg/dL (ref 0.76–1.27)
Globulin, Total: 3.7 g/dL (ref 1.5–4.5)
Glucose: 106 mg/dL — ABNORMAL HIGH (ref 70–99)
Potassium: 3.9 mmol/L (ref 3.5–5.2)
Sodium: 139 mmol/L (ref 134–144)
Total Protein: 8.2 g/dL (ref 6.0–8.5)
eGFR: 71 mL/min/{1.73_m2} (ref >59)

## 2022-01-09 ENCOUNTER — Telehealth: Payer: Self-pay

## 2022-01-09 NOTE — Telephone Encounter (Signed)
Pt called and asking to send him a water pill med to [facility]. He couldn't read the last prescription of one he had before

## 2022-01-10 ENCOUNTER — Telehealth: Payer: Self-pay | Admitting: Nurse Practitioner

## 2022-01-10 ENCOUNTER — Other Ambulatory Visit: Payer: Self-pay | Admitting: Nurse Practitioner

## 2022-01-10 DIAGNOSIS — I10 Essential (primary) hypertension: Secondary | ICD-10-CM

## 2022-01-10 MED ORDER — LOSARTAN POTASSIUM-HCTZ 50-12.5 MG PO TABS: 2.0000 | ORAL_TABLET | Freq: Every day | ORAL | 1 refills | Status: DC

## 2022-01-10 NOTE — Telephone Encounter (Signed)
Called requesting pt's prescription for his water pill be sent in

## 2022-01-14 ENCOUNTER — Other Ambulatory Visit: Payer: Self-pay | Admitting: Nurse Practitioner

## 2022-01-14 DIAGNOSIS — I10 Essential (primary) hypertension: Secondary | ICD-10-CM

## 2022-01-14 MED ORDER — LOSARTAN POTASSIUM-HCTZ 50-12.5 MG PO TABS: 2.0000 | ORAL_TABLET | Freq: Every day | ORAL | 1 refills | Status: DC

## 2022-01-21 ENCOUNTER — Telehealth: Payer: Self-pay

## 2022-01-21 DIAGNOSIS — I10 Essential (primary) hypertension: Secondary | ICD-10-CM

## 2022-01-21 MED ORDER — CLONIDINE HCL 0.1 MG PO TABS: 0.1000 mg | ORAL_TABLET | Freq: Every day | ORAL | 0 refills | Status: DC

## 2022-01-21 NOTE — Telephone Encounter (Signed)
No additional notes needed  

## 2022-02-17 ENCOUNTER — Other Ambulatory Visit: Payer: Self-pay | Admitting: Nurse Practitioner

## 2022-02-17 DIAGNOSIS — I10 Essential (primary) hypertension: Secondary | ICD-10-CM

## 2022-02-17 DIAGNOSIS — R000 Tachycardia, unspecified: Secondary | ICD-10-CM

## 2022-02-18 ENCOUNTER — Telehealth: Payer: Self-pay

## 2022-02-18 NOTE — Telephone Encounter (Signed)
Cholesterol refill 

## 2022-02-19 NOTE — Telephone Encounter (Signed)
Pt was called and he states that he has refills on cholesterol medication. He states he does not know the name of the other medication he needs a refill on.

## 2022-02-20 ENCOUNTER — Other Ambulatory Visit: Payer: Self-pay

## 2022-02-20 DIAGNOSIS — R000 Tachycardia, unspecified: Secondary | ICD-10-CM

## 2022-02-20 DIAGNOSIS — I10 Essential (primary) hypertension: Secondary | ICD-10-CM

## 2022-02-20 MED ORDER — METOPROLOL SUCCINATE ER 25 MG PO TB24: 25.0000 mg | ORAL_TABLET | Freq: Every day | ORAL | 2 refills | Status: DC

## 2022-02-20 NOTE — Telephone Encounter (Signed)
Second medication that pt was referencing was sent into pharmacy requested.

## 2022-04-09 ENCOUNTER — Ambulatory Visit: Payer: Self-pay | Admitting: Nurse Practitioner

## 2022-04-30 ENCOUNTER — Other Ambulatory Visit: Payer: Self-pay | Admitting: Nurse Practitioner

## 2022-04-30 DIAGNOSIS — I10 Essential (primary) hypertension: Secondary | ICD-10-CM

## 2022-05-01 NOTE — Telephone Encounter (Signed)
Caller & Relationship to patient:  MRN #  [id number]   Call Back Number:   Date of Last Office Visit: 02/18/2022     Date of Next Office Visit: 05/15/2022    Medication(s) to be Refilled: Amlodipine  Preferred Pharmacy:   ** Please notify patient to allow 48-72 hours to process** **Let patient know to contact pharmacy at the end of the day to make sure medication is ready. ** **If patient has not been seen in a year or longer, book an appointment **Advise to use MyChart for refill requests OR to contact their pharmacy

## 2022-05-07 ENCOUNTER — Ambulatory Visit: Payer: Self-pay | Admitting: Nurse Practitioner

## 2022-05-15 ENCOUNTER — Ambulatory Visit (INDEPENDENT_AMBULATORY_CARE_PROVIDER_SITE_OTHER): Payer: 59 | Admitting: Nurse Practitioner

## 2022-05-15 ENCOUNTER — Encounter: Payer: Self-pay | Admitting: Nurse Practitioner

## 2022-05-15 VITALS — BP 147/90 | HR 77 | Temp 97.7°F | Ht 74.0 in | Wt 305.8 lb

## 2022-05-15 DIAGNOSIS — Z1211 Encounter for screening for malignant neoplasm of colon: Secondary | ICD-10-CM

## 2022-05-15 DIAGNOSIS — I10 Essential (primary) hypertension: Secondary | ICD-10-CM | POA: Diagnosis not present

## 2022-05-15 DIAGNOSIS — Z13220 Encounter for screening for lipoid disorders: Secondary | ICD-10-CM | POA: Diagnosis not present

## 2022-05-15 DIAGNOSIS — Z125 Encounter for screening for malignant neoplasm of prostate: Secondary | ICD-10-CM | POA: Diagnosis not present

## 2022-05-15 DIAGNOSIS — Z1159 Encounter for screening for other viral diseases: Secondary | ICD-10-CM

## 2022-05-15 NOTE — Patient Instructions (Signed)
1. Colon cancer screening  - Cologuard  2. Need for hepatitis C screening test  - Hepatitis C antibody  3. Essential hypertension, benign  - Comprehensive metabolic panel - CBC  4. Lipid screening  - Lipid Panel  5. Prostate cancer screening  - PSA  Follow up:  Follow up in 6 months

## 2022-05-15 NOTE — Progress Notes (Signed)
Complete physical exam  Patient: Albert Yoder   DOB: 08-Sep-1970   52 y.o. Male  MRN: [id number]  Subjective:    Chief Complaint  Patient presents with   Annual Exam    Fasting    Albert Yoder 52 y.o. male  has a past medical history of Hypertension.   Albert Yoder is a 52 y.o. male who presents today for a complete physical exam. He reports consuming a general diet. The patient has a physically strenuous job, but has no regular exercise apart from work.  He generally feels well. He reports sleeping well. He does not have additional problems to discuss today.   Hypertension: Patient here for follow-up of elevated blood pressure. He is not exercising and is trying to be adherent to low salt diet.  Blood pressure is well controlled at home. Cardiac symptoms none. Patient denies chest pain, claudication, dyspnea, and orthopnea.  Cardiovascular risk factors: hypertension, male gender, and obesity (BMI >= 30 kg/m2). Use of agents associated with hypertension: none. History of target organ damage: none. Patient would like to try medication for weight loss - will trial wegovy. Denies f/c/s, n/v/d, hemoptysis, PND, leg swelling Denies chest pain or edema   Most recent fall risk assessment:    01/06/2022    1:31 PM  Fall Risk   Falls in the past year? 0  Number falls in past yr: 0  Injury with Fall? 0  Risk for fall due to : No Fall Risks  Follow up Falls evaluation completed     Most recent depression screenings:    05/15/2022    8:31 AM 01/06/2022    1:32 PM  PHQ 2/9 Scores  PHQ - 2 Score 0 0      Patient Active Problem List   Diagnosis Date Noted   Essential hypertension, benign 09/02/2012   Routine general medical examination at a health care facility 09/02/2012   OSA (obstructive sleep apnea) 09/02/2012   Past Medical History:  Diagnosis Date   Hypertension    Past Surgical History:  Procedure Laterality Date   NO PAST SURGERIES     Social  History   Tobacco Use   Smoking status: Former    Packs/day: 0.30    Years: 20.00    Total pack years: 6.00    Types: Cigarettes    Quit date: 05/02/2017    Years since quitting: 5.0   Smokeless tobacco: Never   Tobacco comments:    "off and on"  Vaping Use   Vaping Use: Never used  Substance Use Topics   Alcohol use: Yes    Alcohol/week: 5.0 standard drinks of alcohol    Types: 5 Cans of beer per week   Drug use: No   No Known Allergies    Patient Care Team: Fenton Foy, NP as PCP - General (Pulmonary Disease)   Outpatient Medications Prior to Visit  Medication Sig   amLODipine (NORVASC) 10 MG tablet Take 1 tablet by mouth once daily   cloNIDine (CATAPRES) 0.1 MG tablet Take 1 tablet (0.1 mg total) by mouth daily.   losartan-hydrochlorothiazide (HYZAAR) 50-12.5 MG tablet Take 2 tablets by mouth daily.   metoprolol succinate (TOPROL-XL) 25 MG 24 hr tablet Take 1 tablet (25 mg total) by mouth daily.   omega-3 acid ethyl esters (LOVAZA) 1 g capsule Take 2 capsules (2 g total) by mouth daily.   rosuvastatin (CRESTOR) 20 MG tablet Take 1 tablet (20 mg total) by mouth daily.  cyclobenzaprine (FLEXERIL) 10 MG tablet Take 1 tablet (10 mg total) by mouth 3 (three) times daily as needed for muscle spasms. (Patient not taking: Reported on 10/04/2021)   No facility-administered medications prior to visit.    Review of Systems  Constitutional: Negative.   HENT: Negative.    Eyes: Negative.   Respiratory: Negative.    Cardiovascular: Negative.   Gastrointestinal: Negative.   Genitourinary: Negative.   Musculoskeletal: Negative.   Skin: Negative.   Neurological: Negative.   Endo/Heme/Allergies: Negative.   Psychiatric/Behavioral: Negative.            Objective:     BP (!) 147/90   Pulse 77   Temp 97.7 F (36.5 C)   Ht 6\' 2"  (1.88 m)   Wt (!) 305 lb 12.8 oz (138.7 kg)   SpO2 98%   BMI 39.26 kg/m  BP Readings from Last 3 Encounters:  05/15/22 (!) 147/90   01/06/22 131/89  10/04/21 (!) 154/94   Wt Readings from Last 3 Encounters:  05/15/22 (!) 305 lb 12.8 oz (138.7 kg)  01/06/22 (!) 306 lb 12.8 oz (139.2 kg)  10/04/21 (!) 308 lb (139.7 kg)      Physical Exam Vitals and nursing note reviewed.  Constitutional:      General: He is not in acute distress.    Appearance: He is well-developed.  Cardiovascular:     Rate and Rhythm: Normal rate and regular rhythm.  Pulmonary:     Effort: Pulmonary effort is normal.     Breath sounds: Normal breath sounds.  Skin:    General: Skin is warm and dry.  Neurological:     Mental Status: He is alert and oriented to person, place, and time.         Assessment & Plan:    Routine Health Maintenance and Physical Exam  Immunization History  Administered Date(s) Administered   Tdap 07/12/2011, 11/30/2014    Health Maintenance  Topic Date Due   Hepatitis C Screening  Never done   Fecal DNA (Cologuard)  Never done   INFLUENZA VACCINE  07/27/2022 (Originally 11/26/2021)   Zoster Vaccines- Shingrix (1 of 2) 08/14/2022 (Originally 03/13/2022)   COVID-19 Vaccine (1) 06/03/2023 (Originally 09/10/1971)   DTaP/Tdap/Td (3 - Td or Tdap) 11/29/2024   HIV Screening  Completed   HPV VACCINES  Aged Out    Discussed health benefits of physical activity, and encouraged him to engage in regular exercise appropriate for his age and condition.  Problem List Items Addressed This Visit       Cardiovascular and Mediastinum   Essential hypertension, benign   Relevant Orders   Comprehensive metabolic panel   CBC   Other Visit Diagnoses     Colon cancer screening    -  Primary   Relevant Orders   Cologuard   Need for hepatitis C screening test       Relevant Orders   Hepatitis C antibody   Lipid screening       Relevant Orders   Lipid Panel   Prostate cancer screening       Relevant Orders   PSA      Return in about 6 months (around 11/13/2022).     Patient Instructions  1. Colon cancer  screening  - Cologuard  2. Need for hepatitis C screening test  - Hepatitis C antibody  3. Essential hypertension, benign  - Comprehensive metabolic panel - CBC  4. Lipid screening  - Lipid Panel  5. Prostate cancer screening  -  PSA  Follow up:  Follow up in 6 months   Fenton Foy, NP 05/15/22

## 2022-05-16 LAB — CBC
Hematocrit: 48.3 % (ref 37.5–51.0)
Hemoglobin: 16.3 g/dL (ref 13.0–17.7)
MCH: 29.9 pg (ref 26.6–33.0)
MCHC: 33.7 g/dL (ref 31.5–35.7)
MCV: 89 fL (ref 79–97)
Platelets: 243 10*3/uL (ref 150–450)
RBC: 5.46 x10E6/uL (ref 4.14–5.80)
RDW: 12.3 % (ref 11.6–15.4)
WBC: 6.5 10*3/uL (ref 3.4–10.8)

## 2022-05-16 LAB — COMPREHENSIVE METABOLIC PANEL
ALT: 38 IU/L (ref 0–44)
AST: 33 IU/L (ref 0–40)
Albumin/Globulin Ratio: 1.5 (ref 1.2–2.2)
Albumin: 4.5 g/dL (ref 4.1–5.1)
Alkaline Phosphatase: 66 IU/L (ref 44–121)
BUN/Creatinine Ratio: 13 (ref 9–20)
BUN: 14 mg/dL (ref 6–24)
Bilirubin Total: 0.5 mg/dL (ref 0.0–1.2)
CO2: 23 mmol/L (ref 20–29)
Calcium: 10 mg/dL (ref 8.7–10.2)
Chloride: 99 mmol/L (ref 96–106)
Creatinine, Ser: 1.08 mg/dL (ref 0.76–1.27)
Globulin, Total: 3.1 g/dL (ref 1.5–4.5)
Glucose: 98 mg/dL (ref 70–99)
Potassium: 4.6 mmol/L (ref 3.5–5.2)
Sodium: 136 mmol/L (ref 134–144)
Total Protein: 7.6 g/dL (ref 6.0–8.5)
eGFR: 84 mL/min/{1.73_m2} (ref >59)

## 2022-05-16 LAB — LIPID PANEL
Chol/HDL Ratio: 3 ratio (ref 0.0–5.0)
Cholesterol, Total: 135 mg/dL (ref 100–199)
HDL: 45 mg/dL (ref >39)
LDL Chol Calc (NIH): 72 mg/dL (ref 0–99)
Triglycerides: 95 mg/dL (ref 0–149)
VLDL Cholesterol Cal: 18 mg/dL (ref 5–40)

## 2022-05-16 LAB — HEPATITIS C ANTIBODY: Hep C Virus Ab: NONREACTIVE

## 2022-05-16 LAB — PSA: Prostate Specific Ag, Serum: 0.5 ng/mL (ref 0.0–4.0)

## 2022-05-27 DIAGNOSIS — Z1211 Encounter for screening for malignant neoplasm of colon: Secondary | ICD-10-CM | POA: Diagnosis not present

## 2022-06-07 LAB — COLOGUARD: COLOGUARD: NEGATIVE

## 2022-06-09 ENCOUNTER — Encounter: Payer: Self-pay | Admitting: Pharmacist

## 2022-06-09 ENCOUNTER — Other Ambulatory Visit: Payer: Self-pay | Admitting: Nurse Practitioner

## 2022-06-09 DIAGNOSIS — R000 Tachycardia, unspecified: Secondary | ICD-10-CM

## 2022-06-09 DIAGNOSIS — I10 Essential (primary) hypertension: Secondary | ICD-10-CM

## 2022-06-16 ENCOUNTER — Encounter: Payer: Self-pay | Admitting: Nurse Practitioner

## 2022-06-16 ENCOUNTER — Ambulatory Visit (INDEPENDENT_AMBULATORY_CARE_PROVIDER_SITE_OTHER): Payer: 59 | Admitting: Nurse Practitioner

## 2022-06-16 VITALS — BP 130/78 | HR 93 | Temp 98.4°F | Ht 74.0 in | Wt 306.0 lb

## 2022-06-16 DIAGNOSIS — L0211 Cutaneous abscess of neck: Secondary | ICD-10-CM | POA: Diagnosis not present

## 2022-06-16 MED ORDER — CEPHALEXIN 500 MG PO CAPS: 500.0000 mg | ORAL_CAPSULE | Freq: Three times a day (TID) | ORAL | 0 refills | Status: AC

## 2022-06-16 NOTE — Progress Notes (Signed)
$@Patientw$  ID: Albert Yoder, male    DOB: 1970/12/21, 52 y.o.   MRN: [id number]  Chief Complaint  Patient presents with   Recurrent Skin Infections    Been there for a while now is painfull    Referring provider: Fenton Foy, NP   HPI   Albert Yoder 52 y.o. male  has a past medical history of Hypertension.    Patient presents today for an acute visit.  He states that he has had a raised area to the back of his neck for the past few years.  This area has never hurt or drained so he never got it checked out.  Over the past week or so he has become red and warm to the touch.  It has become very painful.  He states that it hurts worse when he is trying to lay down to sleep at night.  We discussed that this appears to be an abscess and looks like it originally been a cyst.  We will have patient seen by general surgery for incision and drainage.  Will start patient on Keflex. Denies f/c/s, n/v/d, hemoptysis, PND, leg swelling Denies chest pain or edema    No Known Allergies  Immunization History  Administered Date(s) Administered   Tdap 07/12/2011, 11/30/2014    Past Medical History:  Diagnosis Date   Hypertension     Tobacco History: Social History   Tobacco Use  Smoking Status Former   Packs/day: 0.30   Years: 20.00   Total pack years: 6.00   Types: Cigarettes   Quit date: 05/02/2017   Years since quitting: 5.1  Smokeless Tobacco Never  Tobacco Comments   "off and on"   Counseling given: Not Answered Tobacco comments: "off and on"   Outpatient Encounter Medications as of 06/16/2022  Medication Sig   cephALEXin (KEFLEX) 500 MG capsule Take 1 capsule (500 mg total) by mouth 3 (three) times daily for 10 days.   amLODipine (NORVASC) 10 MG tablet Take 1 tablet by mouth once daily   cloNIDine (CATAPRES) 0.1 MG tablet Take 1 tablet (0.1 mg total) by mouth daily.   cyclobenzaprine (FLEXERIL) 10 MG tablet Take 1 tablet (10 mg total) by mouth 3 (three)  times daily as needed for muscle spasms. (Patient not taking: Reported on 10/04/2021)   losartan-hydrochlorothiazide (HYZAAR) 50-12.5 MG tablet Take 2 tablets by mouth daily.   metoprolol succinate (TOPROL-XL) 25 MG 24 hr tablet Take 1 tablet by mouth once daily   omega-3 acid ethyl esters (LOVAZA) 1 g capsule Take 2 capsules (2 g total) by mouth daily.   rosuvastatin (CRESTOR) 20 MG tablet Take 1 tablet (20 mg total) by mouth daily.   No facility-administered encounter medications on file as of 06/16/2022.     Review of Systems  Review of Systems  Constitutional: Negative.   HENT: Negative.    Cardiovascular: Negative.   Gastrointestinal: Negative.   Skin:        Abscess to neck  Allergic/Immunologic: Negative.   Neurological: Negative.   Psychiatric/Behavioral: Negative.         Physical Exam  BP 130/78   Pulse 93   Temp 98.4 F (36.9 C)   Ht 6' 2"$  (1.88 m)   Wt (!) 306 lb (138.8 kg)   SpO2 98%   BMI 39.29 kg/m   Wt Readings from Last 5 Encounters:  06/16/22 (!) 306 lb (138.8 kg)  05/15/22 (!) 305 lb 12.8 oz (138.7 kg)  01/06/22 (!) 306 lb  12.8 oz (139.2 kg)  10/04/21 (!) 308 lb (139.7 kg)  08/26/21 (!) 302 lb 3.2 oz (137.1 kg)     Physical Exam Vitals and nursing note reviewed.  Constitutional:      General: He is not in acute distress.    Appearance: He is well-developed.  Neck:      Comments: Large raised area consistent with cyst which is erythematous, tender, and warm to the touch. Cardiovascular:     Rate and Rhythm: Normal rate and regular rhythm.  Pulmonary:     Effort: Pulmonary effort is normal.     Breath sounds: Normal breath sounds.  Skin:    General: Skin is warm and dry.  Neurological:     Mental Status: He is alert and oriented to person, place, and time.      Lab Results:  CBC    Component Value Date/Time   WBC 6.5 05/15/2022 0909   WBC 7.1 09/02/2012 1601   RBC 5.46 05/15/2022 0909   RBC 5.46 09/02/2012 1601   HGB 16.3  05/15/2022 0909   HCT 48.3 05/15/2022 0909   PLT 243 05/15/2022 0909   MCV 89 05/15/2022 0909   MCH 29.9 05/15/2022 0909   MCH 30.2 07/25/2011 0848   MCHC 33.7 05/15/2022 0909   MCHC 34.6 09/02/2012 1601   RDW 12.3 05/15/2022 0909   LYMPHSABS 1.8 07/23/2021 1457   MONOABS 0.6 09/02/2012 1601   EOSABS 0.1 07/23/2021 1457   BASOSABS 0.0 07/23/2021 1457    BMET    Component Value Date/Time   NA 136 05/15/2022 0909   K 4.6 05/15/2022 0909   CL 99 05/15/2022 0909   CO2 23 05/15/2022 0909   GLUCOSE 98 05/15/2022 0909   GLUCOSE 92 09/02/2012 1601   BUN 14 05/15/2022 0909   CREATININE 1.08 05/15/2022 0909   CALCIUM 10.0 05/15/2022 0909   GFRNONAA 81 (L) 07/12/2011 1510   GFRAA >90 07/12/2011 1510    BNP No results found for: "BNP"  ProBNP No results found for: "PROBNP"  Imaging: No results found.   Assessment & Plan:   Cutaneous abscess of neck - cephALEXin (KEFLEX) 500 MG capsule; Take 1 capsule (500 mg total) by mouth 3 (three) times daily for 10 days.  Dispense: 30 capsule; Refill: 0 - Ambulatory referral to General Surgery    Follow up:  Follow up in 3 months     Fenton Foy, NP 06/16/2022

## 2022-06-16 NOTE — Patient Instructions (Addendum)
1. Cutaneous abscess of neck  - cephALEXin (KEFLEX) 500 MG capsule; Take 1 capsule (500 mg total) by mouth 3 (three) times daily for 10 days.  Dispense: 30 capsule; Refill: 0 - Ambulatory referral to General Surgery    Follow up:  Follow up in 3 months

## 2022-06-16 NOTE — Assessment & Plan Note (Signed)
-   cephALEXin (KEFLEX) 500 MG capsule; Take 1 capsule (500 mg total) by mouth 3 (three) times daily for 10 days.  Dispense: 30 capsule; Refill: 0 - Ambulatory referral to General Surgery    Follow up:  Follow up in 3 months

## 2022-06-23 ENCOUNTER — Emergency Department (HOSPITAL_COMMUNITY)
Admission: EM | Admit: 2022-06-23 | Discharge: 2022-06-23 | Disposition: A | Discharge status: Home / Self Care | Payer: 59 | Attending: Emergency Medicine | Admitting: Emergency Medicine

## 2022-06-23 DIAGNOSIS — L723 Sebaceous cyst: Secondary | ICD-10-CM | POA: Diagnosis not present

## 2022-06-23 DIAGNOSIS — L089 Local infection of the skin and subcutaneous tissue, unspecified: Secondary | ICD-10-CM | POA: Insufficient documentation

## 2022-06-23 MED ORDER — DOXYCYCLINE HYCLATE 100 MG PO CAPS: 100.0000 mg | ORAL_CAPSULE | Freq: Two times a day (BID) | ORAL | 0 refills | Status: AC

## 2022-06-23 MED ORDER — LIDOCAINE-EPINEPHRINE (PF) 2 %-1:200000 IJ SOLN
10.0000 mL | Freq: Once | INTRAMUSCULAR | Status: AC
  Administered 2022-06-23: 10 mL
  Filled 2022-06-23: qty 20

## 2022-06-23 NOTE — ED Triage Notes (Signed)
Patient here for evaluation of a mass that appeared on the back of his neck a few years ago but Wednesday started to bleed. Patient states the mass was painful with palpation but since is started bleeding on Wednesday the pain is less. Patient is alert, oriented, and in no apparent distress at this time.

## 2022-06-23 NOTE — Discharge Instructions (Signed)
Please read and follow all provided instructions.  Your diagnoses today include:  1. Infected sebaceous cyst    Tests performed today include: Vital signs. See below for your results today.   Medications prescribed:  Doxycycline - antibiotic  You have been prescribed an antibiotic medicine: take the entire course of medicine even if you are feeling better. Stopping early can cause the antibiotic not to work.  Take any prescribed medications only as directed.   Home care instructions:  Follow any educational materials contained in this packet  Follow-up instructions: Please pull out the packing in for 48 hours and start applying warm compresses to the area until it heals completely.  See your doctor in 1 week if it is not significantly improved.  Return instructions:  Return to the Emergency Department if you have: Fever Worsening symptoms Worsening pain Worsening swelling Redness of the skin that moves away from the affected area, especially if it streaks away from the affected area  Any other emergent concerns  Your vital signs today were: BP 130/84 (BP Location: Right Arm)   Pulse 78   Temp 98.8 F (37.1 C) (Oral)   Resp 18   SpO2 95%  If your blood pressure (BP) was elevated above 135/85 this visit, please have this repeated by your doctor within one month. --------------

## 2022-06-23 NOTE — ED Provider Notes (Signed)
[facility] Provider Note   CSN: [id number] Arrival date & time: 06/23/22  1119     History  Chief Complaint  Patient presents with   Mass    Albert Yoder is a 52 y.o. male.  Patient history of hypertension presents to the emergency department today for evaluation of a suspected boil on the back of his neck.  Patient was originally seen by primary care on 06/16/2022.  He was prescribed a course of Keflex which he has taken.  Patient has had worsening swelling and now significant drainage.  Him and his wife have been expressing drainage at home.  No fevers.  No history of diabetes.      Home Medications Prior to Admission medications   Medication Sig Start Date End Date Taking? Authorizing Provider  amLODipine (NORVASC) 10 MG tablet Take 1 tablet by mouth once daily 06/10/22   Fenton Foy, NP  cephALEXin (KEFLEX) 500 MG capsule Take 1 capsule (500 mg total) by mouth 3 (three) times daily for 10 days. 06/16/22 06/26/22  Fenton Foy, NP  cloNIDine (CATAPRES) 0.1 MG tablet Take 1 tablet (0.1 mg total) by mouth daily. 01/21/22   Fenton Foy, NP  cyclobenzaprine (FLEXERIL) 10 MG tablet Take 1 tablet (10 mg total) by mouth 3 (three) times daily as needed for muscle spasms. Patient not taking: Reported on 10/04/2021 07/23/21   Bo Merino I, NP  losartan-hydrochlorothiazide (HYZAAR) 50-12.5 MG tablet Take 2 tablets by mouth daily. 01/14/22 07/13/22  Fenton Foy, NP  metoprolol succinate (TOPROL-XL) 25 MG 24 hr tablet Take 1 tablet by mouth once daily 06/10/22   Fenton Foy, NP  omega-3 acid ethyl esters (LOVAZA) 1 g capsule Take 2 capsules (2 g total) by mouth daily. 01/06/22 05/15/22  Fenton Foy, NP  rosuvastatin (CRESTOR) 20 MG tablet Take 1 tablet (20 mg total) by mouth daily. 01/06/22 01/06/23  Fenton Foy, NP      Allergies    Patient has no known allergies.    Review of Systems   Review of  Systems  Physical Exam Updated Vital Signs BP 130/84 (BP Location: Right Arm)   Pulse 78   Temp 98.8 F (37.1 C) (Oral)   Resp 18   SpO2 95%   Physical Exam Vitals and nursing note reviewed.  Constitutional:      General: He is not in acute distress.    Appearance: He is well-developed.  HENT:     Head: Normocephalic and atraumatic.  Eyes:     General:        Right eye: No discharge.        Left eye: No discharge.     Conjunctiva/sclera: Conjunctivae normal.  Cardiovascular:     Rate and Rhythm: Normal rate and regular rhythm.     Heart sounds: Normal heart sounds.  Pulmonary:     Effort: Pulmonary effort is normal.     Breath sounds: Normal breath sounds.  Abdominal:     Palpations: Abdomen is soft.     Tenderness: There is no abdominal tenderness.  Musculoskeletal:     Cervical back: Normal range of motion and neck supple.  Skin:    General: Skin is warm and dry.     Comments: 10cm x 8cm area of induration to the nape of the neck with active purulent drainage which can be expressed with gentle pressure.  Overlying tissue is macerated.  There is some mild surrounding  cellulitis.  Neurological:     Mental Status: He is alert.     ED Results / Procedures / Treatments   Labs (all labs ordered are listed, but only abnormal results are displayed) Labs Reviewed - No data to display  EKG None  Radiology No results found.  Procedures .Marland KitchenIncision and Drainage  Date/Time: 06/23/2022 3:09 PM  Performed by: Carlisle Cater, PA-C Authorized by: Carlisle Cater, PA-C   Consent:    Consent obtained:  Verbal   Consent given by:  Patient   Risks discussed:  Bleeding, incomplete drainage, pain, infection and damage to other organs Universal protocol:    Patient identity confirmed:  Verbally with patient and provided demographic data Location:    Type:  Cyst   Size:  10cm x 8cm   Location:  Neck   Neck location: midline posterior. Pre-procedure details:    Skin  preparation:  Povidone-iodine Anesthesia:    Anesthesia method:  Local infiltration   Local anesthetic:  Lidocaine 2% WITH epi Procedure type:    Complexity:  Complex Procedure details:    Incision types:  Stab incision   Wound management:  Probed and deloculated   Drainage:  Purulent   Drainage amount:  Copious   Wound treatment:  Drain placed   Packing materials:  1/4 in iodoform gauze   Amount 1/4" iodoform:  10cm Post-procedure details:    Procedure completion:  Tolerated well, no immediate complications Comments:     Extensive amount of purulent material and debris cleared from opening.      Medications Ordered in ED Medications  lidocaine-EPINEPHrine (XYLOCAINE W/EPI) 2 %-1:200000 (PF) injection 10 mL (has no administration in time range)    ED Course/ Medical Decision Making/ A&P    Patient seen and examined. History obtained directly from patient.   Labs/EKG: None ordered  Imaging: None ordered  Medications/Fluids: Ordered: 2% lidocaine with epinephrine.  Most recent vital signs reviewed and are as follows: BP 130/84 (BP Location: Right Arm)   Pulse 78   Temp 98.8 F (37.1 C) (Oral)   Resp 18   SpO2 95%   Initial impression: Abscess to posterior neck, I&D discussed with patient and he agrees to proceed.  3:10 PM Reassessment performed. Patient appears stable.  Tolerated I&D well.  Reviewed pertinent lab work and imaging with patient at bedside. Questions answered.   Most current vital signs reviewed and are as follows: BP 130/84 (BP Location: Right Arm)   Pulse 78   Temp 98.8 F (37.1 C) (Oral)   Resp 18   SpO2 95%   Plan: Discharge to home.   Prescriptions written for: Doxycycline  Other home care instructions discussed: Pull packing in 48 hours, start warm compresses after that, keep covered if at work  ED return instructions discussed: Return with worsening pain, swelling, drainage, fever  Follow-up instructions discussed: Patient  encouraged to follow-up with their PCP in 7 days for recheck.                            Medical Decision Making Risk Prescription drug management.   Patient with what was likely an infected sebaceous cyst, bedside I&D successful.  No signs of sepsis.  Patient was recently on Keflex, will transition to doxycycline due to concern for MRSA.  He appears well, nontoxic.  The patient's vital signs, pertinent lab work and imaging were reviewed and interpreted as discussed in the ED course. Hospitalization was considered for further testing,  treatments, or serial exams/observation. However as patient is well-appearing, has a stable exam, and reassuring studies today, I do not feel that they warrant admission at this time. This plan was discussed with the patient who verbalizes agreement and comfort with this plan and seems reliable and able to return to the Emergency Department with worsening or changing symptoms.          Final Clinical Impression(s) / ED Diagnoses Final diagnoses:  Infected sebaceous cyst    Rx / DC Orders ED Discharge Orders          Ordered    doxycycline (VIBRAMYCIN) 100 MG capsule  2 times daily        06/23/22 1500              Carlisle Cater, PA-C 06/23/22 [place], [place], DO 06/23/22 1515

## 2022-06-24 ENCOUNTER — Telehealth: Payer: Self-pay | Admitting: Nurse Practitioner

## 2022-06-24 NOTE — Telephone Encounter (Signed)
Patient Albert Yoder on nurse line Monday, 06/23/22 requesting call back regarding the cyst on his neck that was discussed at his most recent appointment.

## 2022-06-25 NOTE — Telephone Encounter (Signed)
Please call patient back to see what questions he has. Thanks.

## 2022-06-27 NOTE — Telephone Encounter (Signed)
Called pt back no answer. [place]

## 2022-08-14 ENCOUNTER — Ambulatory Visit: Payer: 59 | Admitting: Nurse Practitioner

## 2022-08-18 ENCOUNTER — Other Ambulatory Visit: Payer: Self-pay | Admitting: Nurse Practitioner

## 2022-08-18 DIAGNOSIS — I10 Essential (primary) hypertension: Secondary | ICD-10-CM

## 2022-08-18 DIAGNOSIS — R000 Tachycardia, unspecified: Secondary | ICD-10-CM

## 2022-08-22 ENCOUNTER — Ambulatory Visit: Payer: 59 | Admitting: Nurse Practitioner

## 2022-08-25 ENCOUNTER — Ambulatory Visit: Payer: 59 | Admitting: Nurse Practitioner

## 2022-09-03 ENCOUNTER — Encounter: Payer: Self-pay | Admitting: Nurse Practitioner

## 2022-09-03 ENCOUNTER — Ambulatory Visit (INDEPENDENT_AMBULATORY_CARE_PROVIDER_SITE_OTHER): Payer: Self-pay | Admitting: Nurse Practitioner

## 2022-09-03 VITALS — BP 136/76 | HR 85 | Temp 97.0°F | Wt 309.4 lb

## 2022-09-03 DIAGNOSIS — I10 Essential (primary) hypertension: Secondary | ICD-10-CM

## 2022-09-03 DIAGNOSIS — E781 Pure hyperglyceridemia: Secondary | ICD-10-CM | POA: Insufficient documentation

## 2022-09-03 MED ORDER — OMEGA-3-ACID ETHYL ESTERS 1 G PO CAPS
2.0000 g | ORAL_CAPSULE | Freq: Every day | ORAL | 2 refills | Status: DC
Start: 2022-09-03 — End: 2022-12-15

## 2022-09-03 NOTE — Progress Notes (Signed)
@Patient  ID: Albert Yoder, male    DOB: [birth year], 52 y.o.   MRN: [id number]  Chief Complaint  Patient presents with   Follow-up    Over all    Referring provider: Ivonne Andrew, NP   HPI     Albert Yoder 52 y.o. male  has a past medical history of Hypertension.    Albert Yoder is a 52 y.o. male who presents today for a follow up. He reports consuming a general diet. The patient has a physically strenuous job, but has no regular exercise apart from work.  He generally feels well. He reports sleeping well. He does not have additional problems to discuss today.    Hypertension: Patient here for follow-up of elevated blood pressure. He is not exercising and is trying to be adherent to low salt diet.  Blood pressure is well controlled at home. Cardiac symptoms none. Patient denies chest pain, claudication, dyspnea, and orthopnea.  Cardiovascular risk factors: hypertension, male gender, and obesity (BMI >= 30 kg/m2). Use of agents associated with hypertension: none. History of target organ damage: none. Patient would like to try medication for weight loss - will trial wegovy. Denies f/c/s, n/v/d, hemoptysis, PND, leg swelling Denies chest pain or edema  Patient does need a refill on lovaza today.   No Known Allergies  Immunization History  Administered Date(s) Administered   Tdap 07/12/2011, 11/30/2014    Past Medical History:  Diagnosis Date   Hypertension     Tobacco History: Social History   Tobacco Use  Smoking Status Former   Packs/day: 0.30   Years: 20.00   Additional pack years: 0.00   Total pack years: 6.00   Types: Cigarettes   Quit date: 05/02/2017   Years since quitting: 5.3  Smokeless Tobacco Never  Tobacco Comments   "off and on"   Counseling given: Not Answered Tobacco comments: "off and on"   Outpatient Encounter Medications as of 09/03/2022  Medication Sig   amLODipine (NORVASC) 10 MG tablet Take 1 tablet by mouth once  daily   cloNIDine (CATAPRES) 0.1 MG tablet Take 1 tablet (0.1 mg total) by mouth daily.   losartan-hydrochlorothiazide (HYZAAR) 50-12.5 MG tablet Take 2 tablets by mouth once daily   metoprolol succinate (TOPROL-XL) 25 MG 24 hr tablet Take 1 tablet by mouth once daily   rosuvastatin (CRESTOR) 20 MG tablet Take 1 tablet (20 mg total) by mouth daily.   [DISCONTINUED] omega-3 acid ethyl esters (LOVAZA) 1 g capsule Take 2 capsules (2 g total) by mouth daily.   cyclobenzaprine (FLEXERIL) 10 MG tablet Take 1 tablet (10 mg total) by mouth 3 (three) times daily as needed for muscle spasms. (Patient not taking: Reported on 10/04/2021)   doxycycline (VIBRAMYCIN) 100 MG capsule Take 1 capsule (100 mg total) by mouth 2 (two) times daily. (Patient not taking: Reported on 09/03/2022)   omega-3 acid ethyl esters (LOVAZA) 1 g capsule Take 2 capsules (2 g total) by mouth daily.   No facility-administered encounter medications on file as of 09/03/2022.     Review of Systems  Review of Systems  Constitutional: Negative.   HENT: Negative.    Cardiovascular: Negative.   Gastrointestinal: Negative.   Allergic/Immunologic: Negative.   Neurological: Negative.   Psychiatric/Behavioral: Negative.         Physical Exam  BP 136/76   Pulse 85   Temp (!) 97 F (36.1 C)   Wt (!) 309 lb 6.4 oz (140.3 kg)   SpO2 95%  BMI 39.72 kg/m   Wt Readings from Last 5 Encounters:  09/03/22 (!) 309 lb 6.4 oz (140.3 kg)  06/16/22 (!) 306 lb (138.8 kg)  05/15/22 (!) 305 lb 12.8 oz (138.7 kg)  01/06/22 (!) 306 lb 12.8 oz (139.2 kg)  10/04/21 (!) 308 lb (139.7 kg)     Physical Exam Vitals and nursing note reviewed.  Constitutional:      General: He is not in acute distress.    Appearance: He is well-developed.  Cardiovascular:     Rate and Rhythm: Normal rate and regular rhythm.  Pulmonary:     Effort: Pulmonary effort is normal.     Breath sounds: Normal breath sounds.  Skin:    General: Skin is warm and dry.   Neurological:     Mental Status: He is alert and oriented to person, place, and time.      Lab Results:  CBC    Component Value Date/Time   WBC 6.5 05/15/2022 0909   WBC 7.1 09/02/2012 1601   RBC 5.46 05/15/2022 0909   RBC 5.46 09/02/2012 1601   HGB 16.3 05/15/2022 0909   HCT 48.3 05/15/2022 0909   PLT 243 05/15/2022 0909   MCV 89 05/15/2022 0909   MCH 29.9 05/15/2022 0909   MCH 30.2 07/25/2011 0848   MCHC 33.7 05/15/2022 0909   MCHC 34.6 09/02/2012 1601   RDW 12.3 05/15/2022 0909   LYMPHSABS 1.8 07/23/2021 1457   MONOABS 0.6 09/02/2012 1601   EOSABS 0.1 07/23/2021 1457   BASOSABS 0.0 07/23/2021 1457    BMET    Component Value Date/Time   NA 136 05/15/2022 0909   K 4.6 05/15/2022 0909   CL 99 05/15/2022 0909   CO2 23 05/15/2022 0909   GLUCOSE 98 05/15/2022 0909   GLUCOSE 92 09/02/2012 1601   BUN 14 05/15/2022 0909   CREATININE 1.08 05/15/2022 0909   CALCIUM 10.0 05/15/2022 0909   GFRNONAA 81 (L) 07/12/2011 1510   GFRAA >90 07/12/2011 1510      Assessment & Plan:   Hypertriglyceridemia - omega-3 acid ethyl esters (LOVAZA) 1 g capsule; Take 2 capsules (2 g total) by mouth daily.  Dispense: 60 capsule; Refill: 2 - CBC - Comprehensive metabolic panel   2. Essential hypertension, benign  Continue current medications  Low salt diet  Stay active    Follow up:  Follow up in 3 months     Ivonne Andrew, NP 09/03/2022

## 2022-09-03 NOTE — Patient Instructions (Addendum)
1. Hypertriglyceridemia  - omega-3 acid ethyl esters (LOVAZA) 1 g capsule; Take 2 capsules (2 g total) by mouth daily.  Dispense: 60 capsule; Refill: 2 - CBC - Comprehensive metabolic panel   2. Essential hypertension, benign  Continue current medications  Low salt diet  Stay active    Follow up:  Follow up in 3 months

## 2022-09-03 NOTE — Assessment & Plan Note (Signed)
-   omega-3 acid ethyl esters (LOVAZA) 1 g capsule; Take 2 capsules (2 g total) by mouth daily.  Dispense: 60 capsule; Refill: 2 - CBC - Comprehensive metabolic panel   2. Essential hypertension, benign  Continue current medications  Low salt diet  Stay active    Follow up:  Follow up in 3 months

## 2022-09-04 LAB — COMPREHENSIVE METABOLIC PANEL
ALT: 33 IU/L (ref 0–44)
AST: 36 IU/L (ref 0–40)
Albumin/Globulin Ratio: 1.5 (ref 1.2–2.2)
Albumin: 4.8 g/dL (ref 4.1–5.1)
Alkaline Phosphatase: 59 IU/L (ref 44–121)
BUN/Creatinine Ratio: 14 (ref 9–20)
BUN: 15 mg/dL (ref 6–24)
Bilirubin Total: 0.8 mg/dL (ref 0.0–1.2)
CO2: 23 mmol/L (ref 20–29)
Calcium: 9.9 mg/dL (ref 8.7–10.2)
Chloride: 104 mmol/L (ref 96–106)
Creatinine, Ser: 1.06 mg/dL (ref 0.76–1.27)
Globulin, Total: 3.2 g/dL (ref 1.5–4.5)
Glucose: 95 mg/dL (ref 70–99)
Potassium: 4.1 mmol/L (ref 3.5–5.2)
Sodium: 143 mmol/L (ref 134–144)
Total Protein: 8 g/dL (ref 6.0–8.5)
eGFR: 85 mL/min/{1.73_m2} (ref >59)

## 2022-09-04 LAB — CBC
Hematocrit: 47.3 % (ref 37.5–51.0)
Hemoglobin: 15.4 g/dL (ref 13.0–17.7)
MCH: 28.4 pg (ref 26.6–33.0)
MCHC: 32.6 g/dL (ref 31.5–35.7)
MCV: 87 fL (ref 79–97)
Platelets: 262 10*3/uL (ref 150–450)
RBC: 5.42 x10E6/uL (ref 4.14–5.80)
RDW: 13.4 % (ref 11.6–15.4)
WBC: 7.6 10*3/uL (ref 3.4–10.8)

## 2022-12-04 ENCOUNTER — Ambulatory Visit: Payer: Self-pay | Admitting: Nurse Practitioner

## 2022-12-12 ENCOUNTER — Ambulatory Visit: Payer: Self-pay | Admitting: Nurse Practitioner

## 2022-12-15 ENCOUNTER — Ambulatory Visit (INDEPENDENT_AMBULATORY_CARE_PROVIDER_SITE_OTHER): Payer: Self-pay | Admitting: Nurse Practitioner

## 2022-12-15 ENCOUNTER — Encounter: Payer: Self-pay | Admitting: Nurse Practitioner

## 2022-12-15 DIAGNOSIS — E781 Pure hyperglyceridemia: Secondary | ICD-10-CM

## 2022-12-15 DIAGNOSIS — I10 Essential (primary) hypertension: Secondary | ICD-10-CM

## 2022-12-15 DIAGNOSIS — R000 Tachycardia, unspecified: Secondary | ICD-10-CM

## 2022-12-15 DIAGNOSIS — E7889 Other lipoprotein metabolism disorders: Secondary | ICD-10-CM

## 2022-12-15 MED ORDER — METOPROLOL SUCCINATE ER 25 MG PO TB24: 25.0000 mg | ORAL_TABLET | Freq: Every day | ORAL | 0 refills | Status: DC

## 2022-12-15 MED ORDER — OMEGA-3-ACID ETHYL ESTERS 1 G PO CAPS
2.0000 g | ORAL_CAPSULE | Freq: Every day | ORAL | 2 refills | Status: DC
Start: 2022-12-15 — End: 2023-06-17

## 2022-12-15 MED ORDER — LOSARTAN POTASSIUM-HCTZ 50-12.5 MG PO TABS: 2.0000 | ORAL_TABLET | Freq: Every day | ORAL | 0 refills | Status: DC

## 2022-12-15 MED ORDER — ROSUVASTATIN CALCIUM 20 MG PO TABS
20.0000 mg | ORAL_TABLET | Freq: Every day | ORAL | 11 refills | Status: DC
Start: 2022-12-15 — End: 2023-06-17

## 2022-12-15 MED ORDER — AMLODIPINE BESYLATE 10 MG PO TABS: 10.0000 mg | ORAL_TABLET | Freq: Every day | ORAL | 0 refills | Status: DC

## 2022-12-15 NOTE — Progress Notes (Signed)
@Patient  ID: Albert Yoder, male    DOB: 02/01/1971, 52 y.o.   MRN: [id number]  Chief Complaint  Patient presents with   Hypertension    Follow up    Referring provider: Ivonne Andrew, NP   HPI    Albert Yoder is a 52 y.o. male who presents today for a follow up. He reports consuming a general diet. The patient has a physically strenuous job, but has no regular exercise apart from work.  He generally feels well. He reports sleeping well. He does not have additional problems to discuss today.     Hypertension: Patient here for follow-up of elevated blood pressure. He is not exercising and is trying to be adherent to low salt diet.  Blood pressure is well controlled at home. Cardiac symptoms none. Patient denies chest pain, claudication, dyspnea, and orthopnea.  Cardiovascular risk factors: hypertension, male gender, and obesity (BMI >= 30 kg/m2). Use of agents associated with hypertension: none. History of target organ damage: none.  Denies f/c/s, n/v/d, hemoptysis, PND, leg swelling. Denies chest pain or edema.      No Known Allergies  Immunization History  Administered Date(s) Administered   Tdap 07/12/2011, 11/30/2014    Past Medical History:  Diagnosis Date   Hypertension     Tobacco History: Social History   Tobacco Use  Smoking Status Former   Current packs/day: 0.00   Average packs/day: 0.3 packs/day for 20.0 years (6.0 ttl pk-yrs)   Types: Cigarettes   Start date: 05/02/1997   Quit date: 05/02/2017   Years since quitting: 5.6  Smokeless Tobacco Never  Tobacco Comments   "off and on"   Counseling given: Not Answered Tobacco comments: "off and on"   Outpatient Encounter Medications as of 12/15/2022  Medication Sig   cyclobenzaprine (FLEXERIL) 10 MG tablet Take 1 tablet (10 mg total) by mouth 3 (three) times daily as needed for muscle spasms.   [DISCONTINUED] amLODipine (NORVASC) 10 MG tablet Take 1 tablet by mouth once daily    [DISCONTINUED] losartan-hydrochlorothiazide (HYZAAR) 50-12.5 MG tablet Take 2 tablets by mouth once daily   [DISCONTINUED] metoprolol succinate (TOPROL-XL) 25 MG 24 hr tablet Take 1 tablet by mouth once daily   [DISCONTINUED] omega-3 acid ethyl esters (LOVAZA) 1 g capsule Take 2 capsules (2 g total) by mouth daily.   [DISCONTINUED] rosuvastatin (CRESTOR) 20 MG tablet Take 1 tablet (20 mg total) by mouth daily.   amLODipine (NORVASC) 10 MG tablet Take 1 tablet (10 mg total) by mouth daily.   cloNIDine (CATAPRES) 0.1 MG tablet Take 1 tablet (0.1 mg total) by mouth daily. (Patient not taking: Reported on 12/15/2022)   doxycycline (VIBRAMYCIN) 100 MG capsule Take 1 capsule (100 mg total) by mouth 2 (two) times daily. (Patient not taking: Reported on 09/03/2022)   losartan-hydrochlorothiazide (HYZAAR) 50-12.5 MG tablet Take 2 tablets by mouth daily.   metoprolol succinate (TOPROL-XL) 25 MG 24 hr tablet Take 1 tablet (25 mg total) by mouth daily.   omega-3 acid ethyl esters (LOVAZA) 1 g capsule Take 2 capsules (2 g total) by mouth daily.   rosuvastatin (CRESTOR) 20 MG tablet Take 1 tablet (20 mg total) by mouth daily.   No facility-administered encounter medications on file as of 12/15/2022.     Review of Systems  Review of Systems  Constitutional: Negative.   HENT: Negative.    Cardiovascular: Negative.   Gastrointestinal: Negative.   Allergic/Immunologic: Negative.   Neurological: Negative.   Psychiatric/Behavioral: Negative.  Physical Exam  BP 134/71   Pulse 84   Temp (!) 97 F (36.1 C)   Wt (!) 301 lb (136.5 kg)   SpO2 97%   BMI 38.65 kg/m   Wt Readings from Last 5 Encounters:  12/15/22 (!) 301 lb (136.5 kg)  09/03/22 (!) 309 lb 6.4 oz (140.3 kg)  06/16/22 (!) 306 lb (138.8 kg)  05/15/22 (!) 305 lb 12.8 oz (138.7 kg)  01/06/22 (!) 306 lb 12.8 oz (139.2 kg)     Physical Exam Vitals and nursing note reviewed.  Constitutional:      General: He is not in acute  distress.    Appearance: He is well-developed.  Cardiovascular:     Rate and Rhythm: Normal rate and regular rhythm.  Pulmonary:     Effort: Pulmonary effort is normal.     Breath sounds: Normal breath sounds.  Skin:    General: Skin is warm and dry.  Neurological:     Mental Status: He is alert and oriented to person, place, and time.      Lab Results:  CBC    Component Value Date/Time   WBC 7.6 09/03/2022 1526   WBC 7.1 09/02/2012 1601   RBC 5.42 09/03/2022 1526   RBC 5.46 09/02/2012 1601   HGB 15.4 09/03/2022 1526   HCT 47.3 09/03/2022 1526   PLT 262 09/03/2022 1526   MCV 87 09/03/2022 1526   MCH 28.4 09/03/2022 1526   MCH 30.2 07/25/2011 0848   MCHC 32.6 09/03/2022 1526   MCHC 34.6 09/02/2012 1601   RDW 13.4 09/03/2022 1526   LYMPHSABS 1.8 07/23/2021 1457   MONOABS 0.6 09/02/2012 1601   EOSABS 0.1 07/23/2021 1457   BASOSABS 0.0 07/23/2021 1457    BMET    Component Value Date/Time   NA 143 09/03/2022 1526   K 4.1 09/03/2022 1526   CL 104 09/03/2022 1526   CO2 23 09/03/2022 1526   GLUCOSE 95 09/03/2022 1526   GLUCOSE 92 09/02/2012 1601   BUN 15 09/03/2022 1526   CREATININE 1.06 09/03/2022 1526   CALCIUM 9.9 09/03/2022 1526   GFRNONAA 81 (L) 07/12/2011 1510   GFRAA >90 07/12/2011 1510     Assessment & Plan:   Hypertriglyceridemia - omega-3 acid ethyl esters (LOVAZA) 1 g capsule; Take 2 capsules (2 g total) by mouth daily.  Dispense: 60 capsule; Refill: 2   2. Essential hypertension, benign  - metoprolol succinate (TOPROL-XL) 25 MG 24 hr tablet; Take 1 tablet (25 mg total) by mouth daily.  Dispense: 90 tablet; Refill: 0 - losartan-hydrochlorothiazide (HYZAAR) 50-12.5 MG tablet; Take 2 tablets by mouth daily.  Dispense: 180 tablet; Refill: 0 - amLODipine (NORVASC) 10 MG tablet; Take 1 tablet (10 mg total) by mouth daily.  Dispense: 90 tablet; Refill: 0   3. Tachycardia  - metoprolol succinate (TOPROL-XL) 25 MG 24 hr tablet; Take 1 tablet (25 mg  total) by mouth daily.  Dispense: 90 tablet; Refill: 0   4. Lipids abnormal  - rosuvastatin (CRESTOR) 20 MG tablet; Take 1 tablet (20 mg total) by mouth daily.  Dispense: 30 tablet; Refill: 11   Follow up:  Follow up in 6 months     Ivonne Andrew, NP 12/15/2022

## 2022-12-15 NOTE — Assessment & Plan Note (Signed)
-   omega-3 acid ethyl esters (LOVAZA) 1 g capsule; Take 2 capsules (2 g total) by mouth daily.  Dispense: 60 capsule; Refill: 2   2. Essential hypertension, benign  - metoprolol succinate (TOPROL-XL) 25 MG 24 hr tablet; Take 1 tablet (25 mg total) by mouth daily.  Dispense: 90 tablet; Refill: 0 - losartan-hydrochlorothiazide (HYZAAR) 50-12.5 MG tablet; Take 2 tablets by mouth daily.  Dispense: 180 tablet; Refill: 0 - amLODipine (NORVASC) 10 MG tablet; Take 1 tablet (10 mg total) by mouth daily.  Dispense: 90 tablet; Refill: 0   3. Tachycardia  - metoprolol succinate (TOPROL-XL) 25 MG 24 hr tablet; Take 1 tablet (25 mg total) by mouth daily.  Dispense: 90 tablet; Refill: 0   4. Lipids abnormal  - rosuvastatin (CRESTOR) 20 MG tablet; Take 1 tablet (20 mg total) by mouth daily.  Dispense: 30 tablet; Refill: 11   Follow up:  Follow up in 6 months

## 2022-12-15 NOTE — Patient Instructions (Addendum)
1. Hypertriglyceridemia  - omega-3 acid ethyl esters (LOVAZA) 1 g capsule; Take 2 capsules (2 g total) by mouth daily.  Dispense: 60 capsule; Refill: 2   2. Essential hypertension, benign  - metoprolol succinate (TOPROL-XL) 25 MG 24 hr tablet; Take 1 tablet (25 mg total) by mouth daily.  Dispense: 90 tablet; Refill: 0 - losartan-hydrochlorothiazide (HYZAAR) 50-12.5 MG tablet; Take 2 tablets by mouth daily.  Dispense: 180 tablet; Refill: 0 - amLODipine (NORVASC) 10 MG tablet; Take 1 tablet (10 mg total) by mouth daily.  Dispense: 90 tablet; Refill: 0   3. Tachycardia  - metoprolol succinate (TOPROL-XL) 25 MG 24 hr tablet; Take 1 tablet (25 mg total) by mouth daily.  Dispense: 90 tablet; Refill: 0   4. Lipids abnormal  - rosuvastatin (CRESTOR) 20 MG tablet; Take 1 tablet (20 mg total) by mouth daily.  Dispense: 30 tablet; Refill: 11   Follow up:  Follow up in 6 months

## 2022-12-16 LAB — CBC
Hematocrit: 46.1 % (ref 37.5–51.0)
Hemoglobin: 15.4 g/dL (ref 13.0–17.7)
MCH: 28.7 pg (ref 26.6–33.0)
MCHC: 33.4 g/dL (ref 31.5–35.7)
MCV: 86 fL (ref 79–97)
Platelets: 260 10*3/uL (ref 150–450)
RBC: 5.37 x10E6/uL (ref 4.14–5.80)
RDW: 13.3 % (ref 11.6–15.4)
WBC: 8.8 10*3/uL (ref 3.4–10.8)

## 2022-12-16 LAB — COMPREHENSIVE METABOLIC PANEL
ALT: 37 IU/L (ref 0–44)
AST: 35 IU/L (ref 0–40)
Albumin: 4.8 g/dL (ref 4.1–5.1)
Alkaline Phosphatase: 61 IU/L (ref 44–121)
BUN/Creatinine Ratio: 13 (ref 9–20)
BUN: 17 mg/dL (ref 6–24)
Bilirubin Total: 0.7 mg/dL (ref 0.0–1.2)
CO2: 24 mmol/L (ref 20–29)
Calcium: 9.5 mg/dL (ref 8.7–10.2)
Chloride: 100 mmol/L (ref 96–106)
Creatinine, Ser: 1.31 mg/dL — ABNORMAL HIGH (ref 0.76–1.27)
Globulin, Total: 3.1 g/dL (ref 1.5–4.5)
Glucose: 94 mg/dL (ref 70–99)
Potassium: 3.9 mmol/L (ref 3.5–5.2)
Sodium: 139 mmol/L (ref 134–144)
Total Protein: 7.9 g/dL (ref 6.0–8.5)
eGFR: 66 mL/min/{1.73_m2} (ref >59)

## 2023-03-20 ENCOUNTER — Other Ambulatory Visit: Payer: Self-pay | Admitting: Nurse Practitioner

## 2023-03-20 DIAGNOSIS — I10 Essential (primary) hypertension: Secondary | ICD-10-CM

## 2023-05-02 ENCOUNTER — Other Ambulatory Visit: Payer: Self-pay | Admitting: Nurse Practitioner

## 2023-05-02 DIAGNOSIS — R000 Tachycardia, unspecified: Secondary | ICD-10-CM

## 2023-05-02 DIAGNOSIS — I10 Essential (primary) hypertension: Secondary | ICD-10-CM

## 2023-06-17 ENCOUNTER — Telehealth (INDEPENDENT_AMBULATORY_CARE_PROVIDER_SITE_OTHER): Payer: Self-pay | Admitting: Nurse Practitioner

## 2023-06-17 ENCOUNTER — Encounter: Payer: Self-pay | Admitting: Nurse Practitioner

## 2023-06-17 DIAGNOSIS — I10 Essential (primary) hypertension: Secondary | ICD-10-CM

## 2023-06-17 DIAGNOSIS — M2559 Pain in other specified joint: Secondary | ICD-10-CM

## 2023-06-17 DIAGNOSIS — R000 Tachycardia, unspecified: Secondary | ICD-10-CM

## 2023-06-17 DIAGNOSIS — E7889 Other lipoprotein metabolism disorders: Secondary | ICD-10-CM

## 2023-06-17 DIAGNOSIS — E781 Pure hyperglyceridemia: Secondary | ICD-10-CM

## 2023-06-17 MED ORDER — OMEGA-3-ACID ETHYL ESTERS 1 G PO CAPS: 2.0000 g | ORAL_CAPSULE | Freq: Every day | ORAL | 2 refills | Status: AC

## 2023-06-17 MED ORDER — ROSUVASTATIN CALCIUM 20 MG PO TABS
20.0000 mg | ORAL_TABLET | Freq: Every day | ORAL | 11 refills | Status: DC
Start: 2023-06-17 — End: 2023-06-24

## 2023-06-17 MED ORDER — LOSARTAN POTASSIUM-HCTZ 50-12.5 MG PO TABS
2.0000 | ORAL_TABLET | Freq: Every day | ORAL | 0 refills | Status: DC
Start: 2023-06-17 — End: 2023-06-24

## 2023-06-17 MED ORDER — AMLODIPINE BESYLATE 10 MG PO TABS
10.0000 mg | ORAL_TABLET | Freq: Every day | ORAL | 0 refills | Status: DC
Start: 2023-06-17 — End: 2023-06-24

## 2023-06-17 MED ORDER — CYCLOBENZAPRINE HCL 10 MG PO TABS
10.0000 mg | ORAL_TABLET | Freq: Three times a day (TID) | ORAL | 0 refills | Status: AC | PRN
Start: 2023-06-17 — End: ?

## 2023-06-17 MED ORDER — CLONIDINE HCL 0.1 MG PO TABS
0.1000 mg | ORAL_TABLET | Freq: Every day | ORAL | 0 refills | Status: AC
Start: 2023-06-17 — End: ?

## 2023-06-17 MED ORDER — METOPROLOL SUCCINATE ER 25 MG PO TB24
25.0000 mg | ORAL_TABLET | Freq: Every day | ORAL | 0 refills | Status: DC
Start: 2023-06-17 — End: 2023-06-24

## 2023-06-17 NOTE — Patient Instructions (Signed)
1. Essential hypertension, benign  - amLODipine (NORVASC) 10 MG tablet; Take 1 tablet (10 mg total) by mouth daily.  Dispense: 90 tablet; Refill: 0 - cloNIDine (CATAPRES) 0.1 MG tablet; Take 1 tablet (0.1 mg total) by mouth daily.  Dispense: 30 tablet; Refill: 0 - losartan-hydrochlorothiazide (HYZAAR) 50-12.5 MG tablet; Take 2 tablets by mouth daily.  Dispense: 180 tablet; Refill: 0 - metoprolol succinate (TOPROL-XL) 25 MG 24 hr tablet; Take 1 tablet (25 mg total) by mouth daily.  Dispense: 90 tablet; Refill: 0 - CBC; Future - Comprehensive metabolic panel; Future  2. Pain in other joint  - cyclobenzaprine (FLEXERIL) 10 MG tablet; Take 1 tablet (10 mg total) by mouth 3 (three) times daily as needed for muscle spasms.  Dispense: 30 tablet; Refill: 0  3. Tachycardia  - metoprolol succinate (TOPROL-XL) 25 MG 24 hr tablet; Take 1 tablet (25 mg total) by mouth daily.  Dispense: 90 tablet; Refill: 0  4. Hypertriglyceridemia  - omega-3 acid ethyl esters (LOVAZA) 1 g capsule; Take 2 capsules (2 g total) by mouth daily.  Dispense: 60 capsule; Refill: 2  5. Lipids abnormal  - rosuvastatin (CRESTOR) 20 MG tablet; Take 1 tablet (20 mg total) by mouth daily.  Dispense: 30 tablet; Refill: 11 - Lipid Panel; Future

## 2023-06-17 NOTE — Progress Notes (Signed)
Virtual Visit via Telephone Note  I connected with Albert Yoder on 06/17/23 at  3:00 PM EST by telephone and verified that I am speaking with the correct person using two identifiers.  Location: Patient: home Provider: remote   I discussed the limitations, risks, security and privacy concerns of performing an evaluation and management service by telephone and the availability of in person appointments. I also discussed with the patient that there may be a patient responsible charge related to this service. The patient expressed understanding and agreed to proceed.   History of Present Illness:  Hypertension: Patient presents for follow-up of elevated blood pressure through telephone visit. He is exercising and is trying to be adherent to low salt diet.  Blood pressure is well controlled at home. Cardiac symptoms none. Patient denies chest pain, claudication, dyspnea, and orthopnea.  Cardiovascular risk factors: hypertension, male gender, and obesity (BMI >= 30 kg/m2). Use of agents associated with hypertension: none. History of target organ damage: none.  Denies f/c/s, n/v/d, hemoptysis, PND, leg swelling. Denies chest pain or edema.     Observations/Objective:     06/17/2023    2:31 PM 12/15/2022    3:14 PM 09/03/2022    3:05 PM  Vitals with BMI  Height 6\' 2"     Weight 301 lbs 301 lbs   BMI 38.63    Systolic  134 136  Diastolic  71 76  Pulse  84       Assessment and Plan:  1. Essential hypertension, benign  - amLODipine (NORVASC) 10 MG tablet; Take 1 tablet (10 mg total) by mouth daily.  Dispense: 90 tablet; Refill: 0 - cloNIDine (CATAPRES) 0.1 MG tablet; Take 1 tablet (0.1 mg total) by mouth daily.  Dispense: 30 tablet; Refill: 0 - losartan-hydrochlorothiazide (HYZAAR) 50-12.5 MG tablet; Take 2 tablets by mouth daily.  Dispense: 180 tablet; Refill: 0 - metoprolol succinate (TOPROL-XL) 25 MG 24 hr tablet; Take 1 tablet (25 mg total) by mouth daily.  Dispense: 90 tablet;  Refill: 0 - CBC; Future - Comprehensive metabolic panel; Future  2. Pain in other joint  - cyclobenzaprine (FLEXERIL) 10 MG tablet; Take 1 tablet (10 mg total) by mouth 3 (three) times daily as needed for muscle spasms.  Dispense: 30 tablet; Refill: 0  3. Tachycardia  - metoprolol succinate (TOPROL-XL) 25 MG 24 hr tablet; Take 1 tablet (25 mg total) by mouth daily.  Dispense: 90 tablet; Refill: 0  4. Hypertriglyceridemia  - omega-3 acid ethyl esters (LOVAZA) 1 g capsule; Take 2 capsules (2 g total) by mouth daily.  Dispense: 60 capsule; Refill: 2  5. Lipids abnormal  - rosuvastatin (CRESTOR) 20 MG tablet; Take 1 tablet (20 mg total) by mouth daily.  Dispense: 30 tablet; Refill: 11 - Lipid Panel; Future  Follow up:  Follow up in 6 months    I discussed the assessment and treatment plan with the patient. The patient was provided an opportunity to ask questions and all were answered. The patient agreed with the plan and demonstrated an understanding of the instructions.   The patient was advised to call back or seek an in-person evaluation if the symptoms worsen or if the condition fails to improve as anticipated.  I provided 23 minutes of non-face-to-face time during this encounter.   Ivonne Andrew, NP

## 2023-06-24 ENCOUNTER — Other Ambulatory Visit: Payer: Self-pay | Admitting: Nurse Practitioner

## 2023-06-24 ENCOUNTER — Other Ambulatory Visit: Payer: Self-pay

## 2023-06-24 DIAGNOSIS — E7889 Other lipoprotein metabolism disorders: Secondary | ICD-10-CM

## 2023-06-24 DIAGNOSIS — R000 Tachycardia, unspecified: Secondary | ICD-10-CM

## 2023-06-24 DIAGNOSIS — I10 Essential (primary) hypertension: Secondary | ICD-10-CM

## 2023-06-24 MED ORDER — ROSUVASTATIN CALCIUM 20 MG PO TABS: 20.0000 mg | ORAL_TABLET | Freq: Every day | ORAL | 11 refills | Status: DC

## 2023-06-24 MED ORDER — AMLODIPINE BESYLATE 10 MG PO TABS: 10.0000 mg | ORAL_TABLET | Freq: Every day | ORAL | 0 refills | Status: DC

## 2023-06-24 MED ORDER — LOSARTAN POTASSIUM-HCTZ 50-12.5 MG PO TABS: 2.0000 | ORAL_TABLET | Freq: Every day | ORAL | 0 refills | Status: DC

## 2023-06-24 MED ORDER — METOPROLOL SUCCINATE ER 25 MG PO TB24: 25.0000 mg | ORAL_TABLET | Freq: Every day | ORAL | 0 refills | Status: DC

## 2023-06-25 LAB — CBC
Hematocrit: 47.7 % (ref 37.5–51.0)
Hemoglobin: 15.8 g/dL (ref 13.0–17.7)
MCH: 29.6 pg (ref 26.6–33.0)
MCHC: 33.1 g/dL (ref 31.5–35.7)
MCV: 89 fL (ref 79–97)
Platelets: 226 10*3/uL (ref 150–450)
RBC: 5.34 x10E6/uL (ref 4.14–5.80)
RDW: 13 % (ref 11.6–15.4)
WBC: 5.1 10*3/uL (ref 3.4–10.8)

## 2023-06-25 LAB — COMPREHENSIVE METABOLIC PANEL
ALT: 26 [IU]/L (ref 0–44)
AST: 33 [IU]/L (ref 0–40)
Albumin: 4.6 g/dL (ref 3.8–4.9)
Alkaline Phosphatase: 63 [IU]/L (ref 44–121)
BUN/Creatinine Ratio: 13 (ref 9–20)
BUN: 14 mg/dL (ref 6–24)
Bilirubin Total: 0.9 mg/dL (ref 0.0–1.2)
CO2: 24 mmol/L (ref 20–29)
Calcium: 9.4 mg/dL (ref 8.7–10.2)
Chloride: 101 mmol/L (ref 96–106)
Creatinine, Ser: 1.04 mg/dL (ref 0.76–1.27)
Globulin, Total: 2.5 g/dL (ref 1.5–4.5)
Glucose: 99 mg/dL (ref 70–99)
Potassium: 4.3 mmol/L (ref 3.5–5.2)
Sodium: 139 mmol/L (ref 134–144)
Total Protein: 7.1 g/dL (ref 6.0–8.5)
eGFR: 87 mL/min/{1.73_m2} (ref >59)

## 2023-06-25 LAB — LIPID PANEL
Chol/HDL Ratio: 3 {ratio} (ref 0.0–5.0)
Cholesterol, Total: 118 mg/dL (ref 100–199)
HDL: 39 mg/dL — ABNORMAL LOW (ref >39)
LDL Chol Calc (NIH): 58 mg/dL (ref 0–99)
Triglycerides: 115 mg/dL (ref 0–149)
VLDL Cholesterol Cal: 21 mg/dL (ref 5–40)

## 2023-12-23 ENCOUNTER — Other Ambulatory Visit: Payer: Self-pay | Admitting: Nurse Practitioner

## 2023-12-23 DIAGNOSIS — I10 Essential (primary) hypertension: Secondary | ICD-10-CM

## 2023-12-23 DIAGNOSIS — R000 Tachycardia, unspecified: Secondary | ICD-10-CM

## 2024-04-07 ENCOUNTER — Ambulatory Visit (INDEPENDENT_AMBULATORY_CARE_PROVIDER_SITE_OTHER): Payer: Self-pay | Admitting: Nurse Practitioner

## 2024-04-07 VITALS — BP 159/102 | HR 89 | Wt 321.6 lb

## 2024-04-07 DIAGNOSIS — R0681 Apnea, not elsewhere classified: Secondary | ICD-10-CM

## 2024-04-07 DIAGNOSIS — J302 Other seasonal allergic rhinitis: Secondary | ICD-10-CM

## 2024-04-07 DIAGNOSIS — R0683 Snoring: Secondary | ICD-10-CM

## 2024-04-07 DIAGNOSIS — R053 Chronic cough: Secondary | ICD-10-CM

## 2024-04-07 MED ORDER — OMEPRAZOLE 20 MG PO CPDR: 20.0000 mg | DELAYED_RELEASE_CAPSULE | Freq: Every day | ORAL | 3 refills | Status: DC

## 2024-04-07 MED ORDER — MONTELUKAST SODIUM 10 MG PO TABS: 10.0000 mg | ORAL_TABLET | Freq: Every day | ORAL | 3 refills | Status: DC

## 2024-04-07 NOTE — Progress Notes (Unsigned)
 Subjective   Patient ID: Albert Yoder, male    DOB: 02-Feb-1971, 53 y.o.   MRN: [id number]  Chief Complaint  Patient presents with   Sleep Apnea    Snores during sleep, does not have asthma, no migraines or neurological concerns, chokes and gasps in sleep per wife    Referring provider: Oley Bascom RAMAN, NP  Albert Yoder is a 53 y.o. male with Past Medical History: No date: Hypertension   HPI  Patient presents today for an acute visit.  Patient is concerned that he may have sleep apnea.  Patient states that he does snore heavily at night and wife states that she has noticed apneic episodes.  We will place referral for patient to pulmonary for sleep eval.  Patient reports that he does not have excessive daytime sleepiness associated with this.  Patient also reports chronic cough at night.  We will trial omeprazole and Singulair.  Denies f/c/s, n/v/d, hemoptysis, PND, leg swelling Denies chest pain or edema    Allergies[1]  Immunization History  Administered Date(s) Administered   Tdap 07/12/2011, 11/30/2014    Tobacco History: Tobacco Use History[2] Counseling given: Not Answered Tobacco comments: off and on   Outpatient Encounter Medications as of 04/07/2024  Medication Sig   amLODipine  (NORVASC ) 10 MG tablet Take 1 tablet by mouth once daily   cloNIDine  (CATAPRES ) 0.1 MG tablet Take 1 tablet (0.1 mg total) by mouth daily.   losartan -hydrochlorothiazide  (HYZAAR) 50-12.5 MG tablet Take 2 tablets by mouth once daily   metoprolol  succinate (TOPROL -XL) 25 MG 24 hr tablet Take 1 tablet by mouth once daily   montelukast (SINGULAIR) 10 MG tablet Take 1 tablet (10 mg total) by mouth at bedtime.   omeprazole (PRILOSEC) 20 MG capsule Take 1 capsule (20 mg total) by mouth daily.   rosuvastatin  (CRESTOR ) 20 MG tablet Take 1 tablet (20 mg total) by mouth daily.   cyclobenzaprine  (FLEXERIL ) 10 MG tablet Take 1 tablet (10 mg total) by mouth 3 (three) times daily as  needed for muscle spasms. (Patient not taking: Reported on 04/07/2024)   doxycycline  (VIBRAMYCIN ) 100 MG capsule Take 1 capsule (100 mg total) by mouth 2 (two) times daily. (Patient not taking: Reported on 04/07/2024)   omega-3 acid ethyl esters (LOVAZA ) 1 g capsule Take 2 capsules (2 g total) by mouth daily. (Patient not taking: Reported on 04/07/2024)   No facility-administered encounter medications on file as of 04/07/2024.    Review of Systems  Review of Systems  Constitutional: Negative.   HENT: Negative.    Cardiovascular: Negative.   Gastrointestinal: Negative.   Allergic/Immunologic: Negative.   Neurological: Negative.   Psychiatric/Behavioral: Negative.       Objective:   BP (!) 159/102 (BP Location: Left Arm, Patient Position: Sitting, Cuff Size: Large)   Pulse 89   Wt (!) 321 lb 9.6 oz (145.9 kg)   SpO2 100%   BMI 41.29 kg/m   Wt Readings from Last 5 Encounters:  04/07/24 (!) 321 lb 9.6 oz (145.9 kg)  06/17/23 (!) 301 lb (136.5 kg)  12/15/22 (!) 301 lb (136.5 kg)  09/03/22 (!) 309 lb 6.4 oz (140.3 kg)  06/16/22 (!) 306 lb (138.8 kg)     Physical Exam Vitals and nursing note reviewed.  Constitutional:      General: He is not in acute distress.    Appearance: He is well-developed.  Cardiovascular:     Rate and Rhythm: Normal rate and regular rhythm.  Pulmonary:  Effort: Pulmonary effort is normal.     Breath sounds: Normal breath sounds.  Skin:    General: Skin is warm and dry.  Neurological:     Mental Status: He is alert and oriented to person, place, and time.       Assessment & Plan:   Chronic cough -     Omeprazole; Take 1 capsule (20 mg total) by mouth daily.  Dispense: 30 capsule; Refill: 3 -     Pulmonary Visit  Snores -     Pulmonary Visit  Seasonal allergies -     Montelukast Sodium; Take 1 tablet (10 mg total) by mouth at bedtime.  Dispense: 30 tablet; Refill: 3  Witnessed apneic spells -     Pulmonary Visit     Return if  symptoms worsen or fail to improve.   Bascom GORMAN Borer, NP 04/08/2024     [1] No Known Allergies [2]  Social History Tobacco Use  Smoking Status Former   Current packs/day: 0.00   Average packs/day: 0.3 packs/day for 20.0 years (6.0 ttl pk-yrs)   Types: Cigarettes   Start date: 05/02/1997   Quit date: 05/02/2017   Years since quitting: 6.9  Smokeless Tobacco Never  Tobacco Comments   off and on

## 2024-04-08 ENCOUNTER — Encounter: Payer: Self-pay | Admitting: Nurse Practitioner

## 2024-04-14 ENCOUNTER — Encounter: Payer: Self-pay | Admitting: Nurse Practitioner

## 2024-04-14 ENCOUNTER — Ambulatory Visit: Payer: Self-pay | Admitting: Nurse Practitioner

## 2024-04-14 VITALS — BP 152/89 | HR 78 | Wt 323.0 lb

## 2024-04-14 DIAGNOSIS — J302 Other seasonal allergic rhinitis: Secondary | ICD-10-CM

## 2024-04-14 DIAGNOSIS — I10 Essential (primary) hypertension: Secondary | ICD-10-CM

## 2024-04-14 DIAGNOSIS — E7889 Other lipoprotein metabolism disorders: Secondary | ICD-10-CM

## 2024-04-14 DIAGNOSIS — R000 Tachycardia, unspecified: Secondary | ICD-10-CM

## 2024-04-14 DIAGNOSIS — L729 Follicular cyst of the skin and subcutaneous tissue, unspecified: Secondary | ICD-10-CM

## 2024-04-14 DIAGNOSIS — R053 Chronic cough: Secondary | ICD-10-CM

## 2024-04-14 MED ORDER — OMEPRAZOLE 20 MG PO CPDR: 20.0000 mg | DELAYED_RELEASE_CAPSULE | Freq: Every day | ORAL | 3 refills | Status: AC

## 2024-04-14 MED ORDER — MONTELUKAST SODIUM 10 MG PO TABS: 10.0000 mg | ORAL_TABLET | Freq: Every day | ORAL | 3 refills | Status: AC

## 2024-04-14 MED ORDER — AMLODIPINE BESYLATE 10 MG PO TABS: 10.0000 mg | ORAL_TABLET | Freq: Every day | ORAL | 0 refills | Status: AC

## 2024-04-14 MED ORDER — ROSUVASTATIN CALCIUM 20 MG PO TABS: 20.0000 mg | ORAL_TABLET | Freq: Every day | ORAL | 11 refills | Status: AC

## 2024-04-14 MED ORDER — LOSARTAN POTASSIUM-HCTZ 50-12.5 MG PO TABS: 2.0000 | ORAL_TABLET | Freq: Every day | ORAL | 0 refills | Status: AC

## 2024-04-14 MED ORDER — CARVEDILOL 6.25 MG PO TABS: 6.2500 mg | ORAL_TABLET | Freq: Two times a day (BID) | ORAL | 3 refills | Status: DC

## 2024-04-14 MED ORDER — METOPROLOL SUCCINATE ER 25 MG PO TB24: 25.0000 mg | ORAL_TABLET | Freq: Every day | ORAL | 0 refills | Status: DC

## 2024-04-14 NOTE — Progress Notes (Signed)
 Subjective   Patient ID: Albert Yoder, male    DOB: 1970/10/26, 53 y.o.   MRN: [id number]  Chief Complaint  Patient presents with   Follow-up   Blood Pressure Check   Cyst    Top of back, starting to cause pain, refer to dermatologist     Referring provider: Oley Bascom RAMAN, NP  Albert Yoder is a 53 y.o. male with Past Medical History: No date: Hypertension   HPI  Patient presents today for follow-up on hypertension.  Blood pressure was elevated in office today.  We will discontinue metoprolol  and start carvedilol  twice daily.  Pharmacy will follow for medication management.  Patient does need a referral back to general surgery due to cyst to upper back.  He has had this drained in the past but it has gotten larger and painful again. Denies f/c/s, n/v/d, hemoptysis, PND, leg swelling Denies chest pain or edema     Allergies[1]  Immunization History  Administered Date(s) Administered   Tdap 07/12/2011, 11/30/2014    Tobacco History: Tobacco Use History[2] Counseling given: Not Answered Tobacco comments: off and on   Outpatient Encounter Medications as of 04/14/2024  Medication Sig   carvedilol  (COREG ) 6.25 MG tablet Take 1 tablet (6.25 mg total) by mouth 2 (two) times daily with a meal.   cloNIDine  (CATAPRES ) 0.1 MG tablet Take 1 tablet (0.1 mg total) by mouth daily.   [DISCONTINUED] amLODipine  (NORVASC ) 10 MG tablet Take 1 tablet by mouth once daily   [DISCONTINUED] losartan -hydrochlorothiazide  (HYZAAR) 50-12.5 MG tablet Take 2 tablets by mouth once daily   [DISCONTINUED] metoprolol  succinate (TOPROL -XL) 25 MG 24 hr tablet Take 1 tablet by mouth once daily   [DISCONTINUED] montelukast  (SINGULAIR ) 10 MG tablet Take 1 tablet (10 mg total) by mouth at bedtime.   [DISCONTINUED] omeprazole  (PRILOSEC) 20 MG capsule Take 1 capsule (20 mg total) by mouth daily.   [DISCONTINUED] rosuvastatin  (CRESTOR ) 20 MG tablet Take 1 tablet (20 mg total) by mouth daily.    amLODipine  (NORVASC ) 10 MG tablet Take 1 tablet (10 mg total) by mouth daily.   cyclobenzaprine  (FLEXERIL ) 10 MG tablet Take 1 tablet (10 mg total) by mouth 3 (three) times daily as needed for muscle spasms. (Patient not taking: Reported on 04/14/2024)   doxycycline  (VIBRAMYCIN ) 100 MG capsule Take 1 capsule (100 mg total) by mouth 2 (two) times daily. (Patient not taking: Reported on 04/14/2024)   losartan -hydrochlorothiazide  (HYZAAR) 50-12.5 MG tablet Take 2 tablets by mouth daily.   montelukast  (SINGULAIR ) 10 MG tablet Take 1 tablet (10 mg total) by mouth at bedtime.   omega-3 acid ethyl esters (LOVAZA ) 1 g capsule Take 2 capsules (2 g total) by mouth daily. (Patient not taking: Reported on 04/14/2024)   omeprazole  (PRILOSEC) 20 MG capsule Take 1 capsule (20 mg total) by mouth daily.   rosuvastatin  (CRESTOR ) 20 MG tablet Take 1 tablet (20 mg total) by mouth daily.   [DISCONTINUED] metoprolol  succinate (TOPROL -XL) 25 MG 24 hr tablet Take 1 tablet (25 mg total) by mouth daily.   No facility-administered encounter medications on file as of 04/14/2024.    Review of Systems  Review of Systems  Constitutional: Negative.   HENT: Negative.    Cardiovascular: Negative.   Gastrointestinal: Negative.   Allergic/Immunologic: Negative.   Neurological: Negative.   Psychiatric/Behavioral: Negative.       Objective:   BP (!) 152/89 (BP Location: Left Arm, Patient Position: Sitting, Cuff Size: Large) Comment (Cuff Size): thigh cuff  Pulse 78  Wt (!) 323 lb (146.5 kg)   SpO2 96%   BMI 41.47 kg/m   Wt Readings from Last 5 Encounters:  04/14/24 (!) 323 lb (146.5 kg)  04/07/24 (!) 321 lb 9.6 oz (145.9 kg)  06/17/23 (!) 301 lb (136.5 kg)  12/15/22 (!) 301 lb (136.5 kg)  09/03/22 (!) 309 lb 6.4 oz (140.3 kg)     Physical Exam Vitals and nursing note reviewed.  Constitutional:      General: He is not in acute distress.    Appearance: He is well-developed.  Neck:      Comments:  Cutaneous cyst noted Cardiovascular:     Rate and Rhythm: Normal rate and regular rhythm.  Pulmonary:     Effort: Pulmonary effort is normal.     Breath sounds: Normal breath sounds.  Skin:    General: Skin is warm and dry.  Neurological:     Mental Status: He is alert and oriented to person, place, and time.       Assessment & Plan:   Cutaneous cyst -     Ambulatory referral to General Surgery  Essential hypertension, benign -     AMB Referral [facility] Care Management -     amLODIPine  Besylate; Take 1 tablet (10 mg total) by mouth daily.  Dispense: 90 tablet; Refill: 0 -     Losartan  Potassium-HCTZ; Take 2 tablets by mouth daily.  Dispense: 180 tablet; Refill: 0 -     CBC -     Comprehensive metabolic panel with GFR -     Carvedilol ; Take 1 tablet (6.25 mg total) by mouth 2 (two) times daily with a meal.  Dispense: 60 tablet; Refill: 3  Tachycardia  Seasonal allergies -     Montelukast  Sodium; Take 1 tablet (10 mg total) by mouth at bedtime.  Dispense: 30 tablet; Refill: 3  Chronic cough -     Omeprazole ; Take 1 capsule (20 mg total) by mouth daily.  Dispense: 30 capsule; Refill: 3  Lipids abnormal -     Rosuvastatin  Calcium ; Take 1 tablet (20 mg total) by mouth daily.  Dispense: 30 tablet; Refill: 11     Return in about 4 weeks (around 05/12/2024) for blood pressure.   Bascom GORMAN Borer, NP 04/14/2024     [1] No Known Allergies [2]  Social History Tobacco Use  Smoking Status Former   Current packs/day: 0.00   Average packs/day: 0.3 packs/day for 20.0 years (6.0 ttl pk-yrs)   Types: Cigarettes   Start date: 05/02/1997   Quit date: 05/02/2017   Years since quitting: 6.9  Smokeless Tobacco Never  Tobacco Comments   off and on

## 2024-04-15 ENCOUNTER — Ambulatory Visit: Payer: Self-pay | Admitting: Nurse Practitioner

## 2024-04-15 LAB — CBC
Hematocrit: 49.5 % (ref 37.5–51.0)
Hemoglobin: 16.1 g/dL (ref 13.0–17.7)
MCH: 29 pg (ref 26.6–33.0)
MCHC: 32.5 g/dL (ref 31.5–35.7)
MCV: 89 fL (ref 79–97)
Platelets: 226 x10E3/uL (ref 150–450)
RBC: 5.56 x10E6/uL (ref 4.14–5.80)
RDW: 12.9 % (ref 11.6–15.4)
WBC: 6.3 x10E3/uL (ref 3.4–10.8)

## 2024-04-15 LAB — COMPREHENSIVE METABOLIC PANEL WITH GFR
ALT: 36 IU/L (ref 0–44)
AST: 38 IU/L (ref 0–40)
Albumin: 4.8 g/dL (ref 3.8–4.9)
Alkaline Phosphatase: 60 IU/L (ref 47–123)
BUN/Creatinine Ratio: 16 (ref 9–20)
BUN: 17 mg/dL (ref 6–24)
Bilirubin Total: 0.7 mg/dL (ref 0.0–1.2)
CO2: 22 mmol/L (ref 20–29)
Calcium: 10 mg/dL (ref 8.7–10.2)
Chloride: 98 mmol/L (ref 96–106)
Creatinine, Ser: 1.08 mg/dL (ref 0.76–1.27)
Globulin, Total: 3 g/dL (ref 1.5–4.5)
Glucose: 93 mg/dL (ref 70–99)
Potassium: 4.2 mmol/L (ref 3.5–5.2)
Sodium: 139 mmol/L (ref 134–144)
Total Protein: 7.8 g/dL (ref 6.0–8.5)
eGFR: 83 mL/min/1.73

## 2024-04-22 ENCOUNTER — Ambulatory Visit: Payer: Self-pay | Admitting: General Surgery

## 2024-05-03 ENCOUNTER — Telehealth: Payer: Self-pay | Admitting: *Deleted

## 2024-05-03 NOTE — Progress Notes (Signed)
 Care Guide Pharmacy Note  05/03/2024 Name: [name] MRN: [id number] DOB: [birth year]  Referred By: Oley Bascom RAMAN, NP Reason for referral: Complex Care Management (Initial outreach to schedule pharmD referral )   Albert Yoder is a 54 y.o. year old male who is a primary care patient of Oley Bascom RAMAN, NP.  [facility] was referred to the pharmacist for assistance related to: HTN  Successful contact was made with the patient to discuss pharmacy services including being ready for the pharmacist to call at least 5 minutes before the scheduled appointment time and to have medication bottles and any blood pressure readings ready for review. The patient agreed to meet with the pharmacist via in office  on (date/time). 06/14/24 at 300 PM   Harlene Satterfield  [facility]  Direct Dial: [phone]  Fax [phone]

## 2024-05-18 ENCOUNTER — Encounter: Payer: Self-pay | Admitting: Nurse Practitioner

## 2024-05-18 ENCOUNTER — Ambulatory Visit: Payer: Self-pay | Admitting: Nurse Practitioner

## 2024-05-18 ENCOUNTER — Other Ambulatory Visit (HOSPITAL_COMMUNITY): Payer: Self-pay

## 2024-05-18 VITALS — BP 135/90 | HR 84 | Temp 98.1°F | Wt 318.0 lb

## 2024-05-18 DIAGNOSIS — I10 Essential (primary) hypertension: Secondary | ICD-10-CM

## 2024-05-18 MED ORDER — CARVEDILOL 12.5 MG PO TABS: 12.5000 mg | ORAL_TABLET | Freq: Two times a day (BID) | ORAL | 3 refills | Status: AC

## 2024-05-18 NOTE — Patient Instructions (Signed)
 Albert Beverley Idler, MD (Attending) NPI: [id number] (Work) [phone] (Fax) [address] [place], [place] [address] General Surgery

## 2024-05-18 NOTE — Progress Notes (Signed)
 "  Subjective   Patient ID: Albert Yoder, male    DOB: 03-Mar-1971, 54 y.o.   MRN: [id number]  Chief Complaint  Patient presents with   Hypertension    4 weeks.     Referring provider: Oley Bascom RAMAN, NP  Albert Yoder is a 54 y.o. male with Past Medical History: No date: Hypertension   HPI  Patient presents today for follow-up on hypertension.  Blood pressure was elevated in office today.  We will increase dose of carvedilol . pharmacy will follow for medication management.   Denies f/c/s, n/v/d, hemoptysis, PND, leg swelling. Denies chest pain or edema.    Allergies[1]  Immunization History  Administered Date(s) Administered   Tdap 07/12/2011, 11/30/2014    Tobacco History: Tobacco Use History[2] Counseling given: Not Answered Tobacco comments: off and on   Outpatient Encounter Medications as of 05/18/2024  Medication Sig   amLODipine  (NORVASC ) 10 MG tablet Take 1 tablet (10 mg total) by mouth daily.   carvedilol  (COREG ) 12.5 MG tablet Take 1 tablet (12.5 mg total) by mouth 2 (two) times daily with a meal.   cloNIDine  (CATAPRES ) 0.1 MG tablet Take 1 tablet (0.1 mg total) by mouth daily.   cyclobenzaprine  (FLEXERIL ) 10 MG tablet Take 1 tablet (10 mg total) by mouth 3 (three) times daily as needed for muscle spasms.   losartan -hydrochlorothiazide  (HYZAAR) 50-12.5 MG tablet Take 2 tablets by mouth daily.   montelukast  (SINGULAIR ) 10 MG tablet Take 1 tablet (10 mg total) by mouth at bedtime.   omega-3 acid ethyl esters (LOVAZA ) 1 g capsule Take 2 capsules (2 g total) by mouth daily.   omeprazole  (PRILOSEC) 20 MG capsule Take 1 capsule (20 mg total) by mouth daily.   rosuvastatin  (CRESTOR ) 20 MG tablet Take 1 tablet (20 mg total) by mouth daily.   [DISCONTINUED] carvedilol  (COREG ) 6.25 MG tablet Take 1 tablet (6.25 mg total) by mouth 2 (two) times daily with a meal.   doxycycline  (VIBRAMYCIN ) 100 MG capsule Take 1 capsule (100 mg total) by mouth 2 (two)  times daily. (Patient not taking: Reported on 05/18/2024)   No facility-administered encounter medications on file as of 05/18/2024.    Review of Systems  Review of Systems  Constitutional: Negative.   HENT: Negative.    Cardiovascular: Negative.   Gastrointestinal: Negative.   Allergic/Immunologic: Negative.   Neurological: Negative.   Psychiatric/Behavioral: Negative.       Objective:   BP (!) 135/90   Pulse 84   Temp 98.1 F (36.7 C) (Temporal)   Wt (!) 318 lb (144.2 kg)   SpO2 93%   BMI 40.83 kg/m   Wt Readings from Last 5 Encounters:  05/18/24 (!) 318 lb (144.2 kg)  04/14/24 (!) 323 lb (146.5 kg)  04/07/24 (!) 321 lb 9.6 oz (145.9 kg)  06/17/23 (!) 301 lb (136.5 kg)  12/15/22 (!) 301 lb (136.5 kg)     Physical Exam Vitals and nursing note reviewed.  Constitutional:      General: He is not in acute distress.    Appearance: He is well-developed.  Cardiovascular:     Rate and Rhythm: Normal rate and regular rhythm.  Pulmonary:     Effort: Pulmonary effort is normal.     Breath sounds: Normal breath sounds.  Skin:    General: Skin is warm and dry.  Neurological:     Mental Status: He is alert and oriented to person, place, and time.       Assessment & Plan:  Essential hypertension, benign  Other orders -     Carvedilol ; Take 1 tablet (12.5 mg total) by mouth 2 (two) times daily with a meal.  Dispense: 60 tablet; Refill: 3     Return in about 3 months (around 08/16/2024).   Bascom GORMAN Borer, NP 05/18/2024     [1] No Known Allergies [2]  Social History Tobacco Use  Smoking Status Former   Current packs/day: 0.00   Average packs/day: 0.3 packs/day for 20.0 years (6.0 ttl pk-yrs)   Types: Cigarettes   Start date: 05/02/1997   Quit date: 05/02/2017   Years since quitting: 7.0  Smokeless Tobacco Never  Tobacco Comments   off and on   "

## 2024-06-14 ENCOUNTER — Ambulatory Visit: Payer: Self-pay

## 2024-08-17 ENCOUNTER — Ambulatory Visit: Payer: Self-pay | Admitting: Nurse Practitioner
